# Patient Record
Sex: Female | Born: 1970 | Race: White | Hispanic: No | Marital: Single | Smoking: Never smoker
Health system: Southern US, Community
[De-identification: ages and names within clinical notes are randomized; demographics above are authoritative.]

## PROBLEM LIST (undated history)

## (undated) DIAGNOSIS — E7800 Pure hypercholesterolemia, unspecified: Secondary | ICD-10-CM

## (undated) DIAGNOSIS — I10 Essential (primary) hypertension: Secondary | ICD-10-CM

## (undated) DIAGNOSIS — E282 Polycystic ovarian syndrome: Secondary | ICD-10-CM

## (undated) DIAGNOSIS — M1990 Unspecified osteoarthritis, unspecified site: Secondary | ICD-10-CM

## (undated) DIAGNOSIS — E669 Obesity, unspecified: Secondary | ICD-10-CM

## (undated) DIAGNOSIS — G4730 Sleep apnea, unspecified: Secondary | ICD-10-CM

## (undated) HISTORY — PX: CHOLECYSTECTOMY: SHX55

## (undated) HISTORY — DX: Pure hypercholesterolemia, unspecified: E78.00

## (undated) HISTORY — PX: OTHER SURGICAL HISTORY: SHX169

## (undated) HISTORY — DX: Obesity, unspecified: E66.9

## (undated) HISTORY — PX: TOTAL KNEE ARTHROPLASTY: SHX125

## (undated) HISTORY — PX: ABDOMINAL SURGERY: SHX537

## (undated) HISTORY — DX: Sleep apnea, unspecified: G47.30

## (undated) HISTORY — DX: Polycystic ovarian syndrome: E28.2

---

## 1999-05-22 ENCOUNTER — Other Ambulatory Visit: Admission: RE | Admit: 1999-05-22 | Discharge: 1999-05-22 | Payer: Self-pay | Admitting: Obstetrics and Gynecology

## 2000-07-03 ENCOUNTER — Other Ambulatory Visit: Admission: RE | Admit: 2000-07-03 | Discharge: 2000-07-03 | Payer: Self-pay | Admitting: Obstetrics and Gynecology

## 2002-01-26 ENCOUNTER — Encounter: Payer: Self-pay | Admitting: Occupational Medicine

## 2002-01-26 ENCOUNTER — Encounter: Admission: RE | Admit: 2002-01-26 | Discharge: 2002-01-26 | Payer: Self-pay | Admitting: Occupational Medicine

## 2002-11-08 ENCOUNTER — Inpatient Hospital Stay (HOSPITAL_COMMUNITY): Admission: EM | Admit: 2002-11-08 | Discharge: 2002-11-12 | Payer: Self-pay | Admitting: Psychiatry

## 2009-05-22 ENCOUNTER — Encounter: Admission: RE | Admit: 2009-05-22 | Discharge: 2009-05-22 | Payer: Self-pay | Admitting: Internal Medicine

## 2010-06-07 NOTE — Discharge Summary (Signed)
NAME:  Kristin Zhang, Kristin Zhang                          ACCOUNT NO.:  [id number]   MEDICAL RECORD NO.:  [id number]                   PATIENT TYPE:  IPS   LOCATION:  0303                                 FACILITY:  [facility]   PHYSICIAN:  Geoffery Lyons, M.D.                   DATE OF BIRTH:  01-17-1971   DATE OF ADMISSION:  11/08/2002  DATE OF DISCHARGE:  11/12/2002                                 DISCHARGE SUMMARY   CHIEF COMPLAINT AND PRESENT ILLNESS:  This was the first admission to [facility] for this 40 year old single white female voluntarily  admitted.  Referred by the PCP after calling them as she woke up and felt  she was not able to go to work, felt overwhelmed by depression.  Two months  onset of gradually increased severity, anhedonia, episodes of psychomotor  agitation, irritability, thoughts of suicide by overdosing, suspiciousness,  reclusive behaviors.   PAST PSYCHIATRIC HISTORY:  First time at [facility].  Never on  psychotropics.   ALCOHOL/DRUG HISTORY:  Denies the use or abuse of any substances.   PAST MEDICAL HISTORY:  Polycystic ovarian disease, hypothyroidism.   MEDICATIONS:  Metformin, Avandia 8 mg at night, Synthroid 150 mcg daily.   PHYSICAL EXAMINATION:  Performed and failed to show any acute findings.   MENTAL STATUS EXAM:  Fully alert female.  Mood anxious and depressed,  tearful at times.  Speech normal production, tempo, rate.  Affect depressed.  Thought processes positive for some agitation, blocking, suicidal ideation  with a plan when she was admitted.  Denies any plan at the time of this  evaluation.  No homicidal ideation.  No auditory or visual hallucinations.  Cognition well-preserved.   ADMISSION DIAGNOSES:   AXIS I:  Major depression, recurrent.   AXIS II:  No diagnosis.   AXIS III:  1. Hypothyroidism.  2. Polycystic ovary disease.   AXIS IV:  Moderate.   AXIS V:  Global Assessment of Functioning upon admission 30;  highest Global  Assessment of Functioning in the last year 68.   LABORATORY DATA:  CBC with hemoglobin 11.9.  Blood chemistry within normal  limits.  Thyroid profile within normal limits.  TSH 2.154.  Urine pregnancy  test negative.  Drug screen negative for substances of abuse.   HOSPITAL COURSE:  She was admitted and started intensive individual and  group psychotherapy.  She was initially given Ambien for sleep.  She was  given some Seroquel 25 mg every six hours for anxiety and agitation.  Kept  on Avandia 8 mg at night, Synthroid 0.15 mg daily.  She was started on  Lexapro 10 mg per day.  Seroquel was increased to 50 mg at bedtime.  Initially, she was somewhat agitated.  She then started to settle down with  occasional reports of thoughts of wanting to die.  She felt that the stress  was  building up until she was unable to cope.  Had been staying to herself,  isolating, withdrawing, so family or friends were not aware of the way she  was feeling.  There were some suicidal ruminations but she was able to  contract for safety.  As the hospitalization progressed, she started feeling  somewhat better, more hopeful.  She was able to open up, talk in group and  individually about her issues.  Started working and resolving some of them.  We increased the Seroquel, which was effective for sleep.  There was a  family session with the boyfriend, the mother and the sister.  She was to  return home with the boyfriend.  On November 12, 2002, she was in full  contact with reality.  She denied any suicidal or homicidal ideation.  She  was willing and motivated to pursue further outpatient treatment.  The  family felt she was ready to go.  She definitely had more insight and had  worked on her coping skills and stress management.   DISCHARGE DIAGNOSES:   AXIS I:  Major depression, single episode.   AXIS II:  No diagnosis.   AXIS III:  1. Hypothyroidism.  2. Polycystic ovary disease.   AXIS  IV:  Moderate.   AXIS V:  Global Assessment of Functioning upon discharge 55-60.   DISCHARGE MEDICATIONS:  1. Avandia 8 mg at bedtime.  2. Levothyroxine 150 mcg in the morning.  3. Lexapro 10 mg daily.  4. Hydrochlorothiazide 25 mg daily.  5. Seroquel 25 mg, 2 at night.   FOLLOW UP:  Dr. Olena Leatherwood, [facility]                                               Geoffery Lyons, M.D.    IL/MEDQ  D:  12/07/2002  T:  12/07/2002  Job:  [id number]

## 2015-07-15 ENCOUNTER — Ambulatory Visit (HOSPITAL_COMMUNITY)
Admission: EM | Admit: 2015-07-15 | Discharge: 2015-07-15 | Disposition: A | Discharge status: Home / Self Care | Payer: BLUE CROSS/BLUE SHIELD | Attending: Emergency Medicine | Admitting: Emergency Medicine

## 2015-07-15 ENCOUNTER — Encounter (HOSPITAL_COMMUNITY): Payer: Self-pay

## 2015-07-15 DIAGNOSIS — J069 Acute upper respiratory infection, unspecified: Secondary | ICD-10-CM

## 2015-07-15 DIAGNOSIS — B9789 Other viral agents as the cause of diseases classified elsewhere: Principal | ICD-10-CM

## 2015-07-15 HISTORY — DX: Unspecified osteoarthritis, unspecified site: M19.90

## 2015-07-15 HISTORY — DX: Essential (primary) hypertension: I10

## 2015-07-15 MED ORDER — BENZONATATE 200 MG PO CAPS: 200.0000 mg | ORAL_CAPSULE | Freq: Two times a day (BID) | ORAL | Status: DC | PRN

## 2015-07-15 MED ORDER — IPRATROPIUM BROMIDE 0.06 % NA SOLN: 2.0000 | Freq: Four times a day (QID) | NASAL | Status: DC

## 2015-07-15 NOTE — Discharge Instructions (Signed)
You have an upper respiratory infection or cold. Take a daily allergy pill such as Claritin or Zyrtec to help with the congestion and drainage. Use Atrovent nasal spray 4 times a day to help with congestion and drainage. Use the Tessalon twice a day as needed for coughing. Symptoms typically peak on day 3 or 4 and take another 3-4 days to resolve. If you develop fevers, sputum, or trouble breathing, please come back.

## 2015-07-15 NOTE — ED Notes (Signed)
Patient presents with cold-like symptoms x2 days, pt has taken OTC medications to treat symptoms, she is experience nasal congestion, headache, and  persistently coughing No acute distress

## 2015-07-15 NOTE — ED Provider Notes (Signed)
CSN: [id number]     Arrival date & time 07/15/15  1750 History   First MD Initiated Contact with Patient 07/15/15 1805     Chief Complaint  Patient presents with  . Nasal Congestion   (Consider location/radiation/quality/duration/timing/severity/associated sxs/prior Treatment) HPI  She is a 45 year old woman here for evaluation of nasal congestion. Her symptoms started yesterday with nasal congestion, rhinorrhea, sore throat, and cough. Cough is intermittently productive of clear sputum. She denies any wheezing or shortness of breath. No fevers or chills. No nausea or vomiting. No ear pain. She has tried DayQuil without improvement. She states the cough is the most bothersome thing for her.  Past Medical History  Diagnosis Date  . Arthritis   . Hypertension    History reviewed. No pertinent past surgical history. No family history on file. Social History  Substance Use Topics  . Smoking status: Never Smoker   . Smokeless tobacco: Never Used  . Alcohol Use: No   OB History    No data available     Review of Systems As in history of present illness Allergies  Lisinopril  Home Medications   Prior to Admission medications   Medication Sig Start Date End Date Taking? Authorizing Provider  buPROPion (WELLBUTRIN XL) 300 MG 24 hr tablet Take 300 mg by mouth daily.   Yes Historical Provider, MD  Levomilnacipran HCl ER (FETZIMA) 80 MG CP24 Take 80 mg by mouth daily.   Yes Historical Provider, MD  levothyroxine (SYNTHROID, LEVOTHROID) 150 MCG tablet Take 150 mcg by mouth daily before breakfast.   Yes Historical Provider, MD  LOSARTAN POTASSIUM PO Take 150 mg by mouth daily.   Yes Historical Provider, MD  VERAPAMIL HCL PO Take 150 mg by mouth daily.   Yes Historical Provider, MD  VITAMIN E PO Take 2,000 Units by mouth daily.   Yes Historical Provider, MD  benzonatate (TESSALON) 200 MG capsule Take 1 capsule (200 mg total) by mouth 2 (two) times daily as needed for cough. 07/15/15   Charm RingsErin  J Aviv Lengacher, MD  ipratropium (ATROVENT) 0.06 % nasal spray Place 2 sprays into both nostrils 4 (four) times daily. 07/15/15   Charm RingsErin J Raju Coppolino, MD   Meds Ordered and Administered this Visit  Medications - No data to display  BP 146/93 mmHg  Pulse 97  Temp(Src) 98.2 F (36.8 C) (Oral)  Resp 22  SpO2 100%  LMP 07/13/2015 (Exact Date) No data found.   Physical Exam  Constitutional: She is oriented to person, place, and time. She appears well-developed and well-nourished. No distress.  HENT:  Mouth/Throat: No oropharyngeal exudate.  Oropharynx mildly erythematous with postnasal drainage. Nasal discharge present, but mucosa is normal. TMs normal bilaterally.  Neck: Neck supple.  Cardiovascular: Normal rate, regular rhythm and normal heart sounds.   No murmur heard. Pulmonary/Chest: Effort normal and breath sounds normal. No respiratory distress. She has no wheezes. She has no rales.  Lymphadenopathy:    She has no cervical adenopathy.  Neurological: She is alert and oriented to person, place, and time.    ED Course  Procedures (including critical care time)  Labs Review Labs Reviewed - No data to display  Imaging Review No results found.   MDM   1. Viral URI with cough    Symptomatic treatment discussed. Recommended OTC allergy pill daily. Prescriptions given for Atrovent nasal spray and Tessalon for cough. Return precautions reviewed.    Charm RingsErin J Cleaster Shiffer, MD 07/15/15 [phone]

## 2016-11-21 ENCOUNTER — Other Ambulatory Visit: Payer: Self-pay | Admitting: Occupational Medicine

## 2016-11-21 ENCOUNTER — Ambulatory Visit: Payer: Self-pay

## 2016-11-21 DIAGNOSIS — M25522 Pain in left elbow: Secondary | ICD-10-CM

## 2016-11-21 DIAGNOSIS — M25521 Pain in right elbow: Secondary | ICD-10-CM

## 2017-05-18 ENCOUNTER — Ambulatory Visit: Payer: BLUE CROSS/BLUE SHIELD | Admitting: Obstetrics & Gynecology

## 2017-05-18 ENCOUNTER — Encounter: Payer: Self-pay | Admitting: Obstetrics & Gynecology

## 2017-05-18 VITALS — BP 154/93 | HR 104 | Ht 68.0 in | Wt 308.0 lb

## 2017-05-18 DIAGNOSIS — R102 Pelvic and perineal pain: Secondary | ICD-10-CM

## 2017-05-18 NOTE — Progress Notes (Signed)
Pt states that she has lower abdominal pain since Feb. CT scan didn't show anything  Last pap less than a year ago- normal

## 2017-05-18 NOTE — Progress Notes (Signed)
   Subjective:    Patient ID: Kristin Zhang, female    DOB: 1970/03/05, 47 y.o.   MRN: [id number]  HPI 47 yo engaged P0 here today as a new patient with abdominopelvic pain. She had an xray with "fecal retention" and a normal CT done 04/28/17. The pain started about 04-21-17 and is better now. "I just get a twinge now and again". There is nothing that made the pain better or worse. She tried IBU.   She report that she had a pap smear less than a year ago, done at [facility]. It was normal. Her mammogram is due next month. It is set up.  She has occasional dysparuenia since the beginning of the month. She uses OCPs for contraception and treatment of heavy periods.   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    Objective:   Physical Exam Breathing, conversing, and ambulating normally Well nourished, well hydrated White female, no apparent distress Abd- benign, morbidly obese Bimanual exam reveals no masses or tenderness     Assessment & Plan:  abdomino pelvic pain with normal gyn organs seen on CT- pain mostly resolved currently Reassurance given Preventative care- Come back for pap in 3 years/prn sooner

## 2017-10-05 ENCOUNTER — Telehealth: Payer: Self-pay

## 2017-10-05 DIAGNOSIS — IMO0001 Reserved for inherently not codable concepts without codable children: Secondary | ICD-10-CM

## 2017-10-05 MED ORDER — NORETHINDRONE-ETH ESTRADIOL 0.5-35 MG-MCG PO TABS: 1.0000 | ORAL_TABLET | Freq: Every day | ORAL | 7 refills | Status: DC

## 2017-10-05 NOTE — Telephone Encounter (Signed)
Pt called needing refill of OCP. Refills sent in until time for her annual.

## 2018-03-19 ENCOUNTER — Emergency Department (INDEPENDENT_AMBULATORY_CARE_PROVIDER_SITE_OTHER)
Admission: EM | Admit: 2018-03-19 | Discharge: 2018-03-19 | Disposition: A | Discharge status: Home / Self Care | Payer: BLUE CROSS/BLUE SHIELD | Source: Home / Self Care | Attending: Family Medicine | Admitting: Family Medicine

## 2018-03-19 ENCOUNTER — Other Ambulatory Visit: Payer: Self-pay | Admitting: Obstetrics & Gynecology

## 2018-03-19 ENCOUNTER — Other Ambulatory Visit: Payer: Self-pay

## 2018-03-19 DIAGNOSIS — B9789 Other viral agents as the cause of diseases classified elsewhere: Secondary | ICD-10-CM

## 2018-03-19 DIAGNOSIS — J069 Acute upper respiratory infection, unspecified: Secondary | ICD-10-CM | POA: Diagnosis not present

## 2018-03-19 MED ORDER — BENZONATATE 200 MG PO CAPS: ORAL_CAPSULE | ORAL | 0 refills | Status: DC

## 2018-03-19 MED ORDER — DOXYCYCLINE HYCLATE 100 MG PO CAPS: 100.0000 mg | ORAL_CAPSULE | Freq: Two times a day (BID) | ORAL | 0 refills | Status: DC

## 2018-03-19 NOTE — Discharge Instructions (Addendum)
Take plain guaifenesin (1200mg  extended release tabs such as Mucinex) twice daily, with plenty of water, for cough and congestion. Get adequate rest.   For nasal congestion may use Afrin nasal spray (or generic oxymetazoline) each morning for about 5 days and then discontinue.  Also recommend using saline nasal spray several times daily and saline nasal irrigation (AYR is a common brand).  Use Flonase nasal spray each morning after using Afrin nasal spray and saline nasal irrigation. Try warm salt water gargles for sore throat.  Stop all antihistamines for now, and other non-prescription cough/cold preparations. May take Delsym Cough Suppressant with Tessalon at bedtime for nighttime cough.  Begin Doxycycline if not improving about one week or if persistent fever develops

## 2018-03-19 NOTE — ED Provider Notes (Signed)
[facility]    CSN: [id number] Arrival date & time: 03/19/18  1024     History   Chief Complaint Chief Complaint  Patient presents with  . Cough    Sneezing, Chest congestion    HPI Kristin Zhang is a 48 y.o. female.   Patient complains of four day history of typical cold-like symptoms developing over several days, including mild sore throat, sinus congestion, headache, fatigue, and cough.  She denies fevers, chills, and sweats, pleuritic pain, and shortness of breath.  She had pneumonia in 2018  The history is provided by the patient.    Past Medical History:  Diagnosis Date  . Arthritis   . High cholesterol   . Hypertension   . PCOS (polycystic ovarian syndrome)   . Sleep apnea     Patient Active Problem List   Diagnosis Date Noted  . Morbid obesity (HCC) 05/18/2017    Past Surgical History:  Procedure Laterality Date  . appendix removal    . gallbladder removal    . shoulder surgery      OB History    Gravida  0   Para  0   Term  0   Preterm  0   AB  0   Living  0     SAB  0   TAB  0   Ectopic  0   Multiple  0   Live Births  0            Home Medications    Prior to Admission medications   Medication Sig Start Date End Date Taking? Authorizing Provider  atorvastatin (LIPITOR) 20 MG tablet Take 20 mg by mouth daily.    [provider]  benzonatate (TESSALON) 200 MG capsule Take one cap by mouth at bedtime as needed for cough.  May repeat in 4 to 6 hours 03/19/18   Lattie Haw, MD  buPROPion (WELLBUTRIN XL) 300 MG 24 hr tablet Take 300 mg by mouth daily.    [provider]  doxycycline (VIBRAMYCIN) 100 MG capsule Take 1 capsule (100 mg total) by mouth 2 (two) times daily. Take with food (Rx void after 03/27/18). 03/19/18   Lattie Haw, MD  ipratropium (ATROVENT) 0.06 % nasal spray Place 2 sprays into both nostrils 4 (four) times daily. Patient not taking: Reported on 05/18/2017 07/15/15   Charm Rings, MD  Levomilnacipran HCl ER (FETZIMA) 80 MG CP24 Take 80 mg by mouth daily.    [provider]  levothyroxine (SYNTHROID, LEVOTHROID) 150 MCG tablet Take 150 mcg by mouth daily before breakfast.    [provider]  LOSARTAN POTASSIUM PO Take 150 mg by mouth daily.    [provider]  Multiple Vitamin (MULTIVITAMIN) tablet Take 1 tablet by mouth daily.    [provider]  NORTREL 0.5/35, 28, 0.5-35 MG-MCG tablet TAKE 1 TABLET BY MOUTH EVERY DAY 03/19/18   Nicholaus Bloom C, MD  oxycodone-acetaminophen (LYNOX) 10-300 MG tablet Take 1 tablet by mouth every 4 (four) hours as needed for pain.    [provider]  pioglitazone (ACTOS) 45 MG tablet Take 45 mg by mouth daily.    [provider]  VERAPAMIL HCL PO Take 150 mg by mouth daily.    [provider]  VITAMIN E PO Take 2,000 Units by mouth daily.    [provider]    Family History Family History  Problem Relation Age of Onset  . Breast cancer  Sister     Social History Social History   Tobacco Use  . Smoking status: Never Smoker  . Smokeless tobacco: Never Used  Substance Use Topics  . Alcohol use: No  . Drug use: No     Allergies   Lisinopril and Metformin and related   Review of Systems Review of Systems + sore throat + hoarse + cough No pleuritic pain No wheezing + nasal congestion + post-nasal drainage + sinus pain/pressure No itchy/red eyes No earache No hemoptysis No SOB No fever, + chills No nausea No vomiting No abdominal pain No diarrhea No urinary symptoms No skin rash + fatigue No myalgias + headache Used OTC meds without relief   Physical Exam Triage Vital Signs ED Triage Vitals  Enc Vitals Group     BP 03/19/18 1055 (!) 157/96     Pulse Rate 03/19/18 1055 96     Resp 03/19/18 1055 18     Temp 03/19/18 1055 98.1 F (36.7 C)     Temp Source 03/19/18 1055 Oral     SpO2 03/19/18 1055 98 %     Weight 03/19/18 1056  (!) 301 lb (136.5 kg)     Height 03/19/18 1056 5\' 8"  (1.727 m)     Head Circumference --      Peak Flow --      Pain Score 03/19/18 1055 0     Pain Loc --      Pain Edu? --      Excl. in GC? --    No data found.  Updated Vital Signs BP (!) 157/96 (BP Location: Right Arm)   Pulse 96   Temp 98.1 F (36.7 C) (Oral)   Resp 18   Ht 5\' 8"  (1.727 m)   Wt (!) 136.5 kg   LMP 02/19/2018 (LMP Unknown)   SpO2 98%   BMI 45.77 kg/m   Visual Acuity Right Eye Distance:   Left Eye Distance:   Bilateral Distance:    Right Eye Near:   Left Eye Near:    Bilateral Near:     Physical Exam Nursing notes and Vital Signs reviewed. Appearance:  Patient appears stated age, and in no acute distress Eyes:  Pupils are equal, round, and reactive to light and accomodation.  Extraocular movement is intact.  Conjunctivae are not inflamed  Ears:  Canals normal.  Tympanic membranes normal.  Nose:  Congested turbinates.  No sinus tenderness.    Pharynx:  Normal Neck:  Supple.  Enlarged posterior/lateral nodes are palpated bilaterally, tender to palpation on the left.   Lungs:  Clear to auscultation.  Breath sounds are equal.  Moving air well. Heart:  Regular rate and rhythm without murmurs, rubs, or gallops.  Abdomen:  Nontender without masses or hepatosplenomegaly.  Bowel sounds are present.  No CVA or flank tenderness.  Extremities:  No edema.  Skin:  No rash present.    UC Treatments / Results  Labs (all labs ordered are listed, but only abnormal results are displayed) Labs Reviewed - No data to display  EKG None  Radiology No results found.  Procedures Procedures (including critical care time)  Medications Ordered in UC Medications - No data to display  Initial Impression / Assessment and Plan / UC Course  I have reviewed the triage vital signs and the nursing notes.  Pertinent labs & imaging results that were available during my care of the patient were reviewed by me and  considered in my medical decision making (see chart  for details).    There is no evidence of bacterial infection today.  Treat symptomatically for now  Prescription written for Benzonatate (Tessalon) to take at bedtime for night-time cough.  Followup with Family Doctor if not improved in about 10 days.   Final Clinical Impressions(s) / UC Diagnoses   Final diagnoses:  Viral URI with cough     Discharge Instructions     Take plain guaifenesin (  extended release tabs such as Mucinex) twice daily, with plenty of water, for cough and congestion. Get adequate rest.   For nasal congestion may use Afrin nasal spray (or generic oxymetazoline) each morning for about 5 days and then discontinue.  Also recommend using saline nasal spray several times daily and saline nasal irrigation (AYR is a common brand).  Use Flonase nasal spray each morning after using Afrin nasal spray and saline nasal irrigation. Try warm salt water gargles for sore throat.  Stop all antihistamines for now, and other non-prescription cough/cold preparations. May take Delsym Cough Suppressant with Tessalon at bedtime for nighttime cough.  Begin Doxycycline if not improving about one week or if persistent fever develops (Given a prescription to hold, with an expiration date)       ED Prescriptions    Medication Sig Dispense Auth. Provider   benzonatate (TESSALON) 200 MG capsule Take one cap by mouth at bedtime as needed for cough.  May repeat in 4 to 6 hours 15 capsule Lattie Haw, MD   doxycycline (VIBRAMYCIN) 100 MG capsule Take 1 capsule (100 mg total) by mouth 2 (two) times daily. Take with food (Rx void after 03/27/18). 14 capsule Lattie Haw, MD         Lattie Haw, MD 03/19/18 [phone]

## 2018-03-19 NOTE — ED Triage Notes (Signed)
Pt c/o cold like sxs since Tuesday. Sneezing, non productive cough, headache. Taking mucinex and robitussin prn.

## 2018-05-20 ENCOUNTER — Emergency Department
Admission: EM | Admit: 2018-05-20 | Discharge: 2018-05-20 | Disposition: A | Discharge status: Home / Self Care | Payer: BLUE CROSS/BLUE SHIELD | Source: Home / Self Care | Attending: Family Medicine | Admitting: Family Medicine

## 2018-05-20 ENCOUNTER — Emergency Department (INDEPENDENT_AMBULATORY_CARE_PROVIDER_SITE_OTHER): Payer: BLUE CROSS/BLUE SHIELD

## 2018-05-20 ENCOUNTER — Encounter: Payer: Self-pay | Admitting: Emergency Medicine

## 2018-05-20 ENCOUNTER — Other Ambulatory Visit: Payer: Self-pay

## 2018-05-20 DIAGNOSIS — I10 Essential (primary) hypertension: Secondary | ICD-10-CM

## 2018-05-20 DIAGNOSIS — R109 Unspecified abdominal pain: Secondary | ICD-10-CM

## 2018-05-20 DIAGNOSIS — R300 Dysuria: Secondary | ICD-10-CM

## 2018-05-20 DIAGNOSIS — R05 Cough: Secondary | ICD-10-CM | POA: Diagnosis not present

## 2018-05-20 DIAGNOSIS — R197 Diarrhea, unspecified: Secondary | ICD-10-CM

## 2018-05-20 DIAGNOSIS — R053 Chronic cough: Secondary | ICD-10-CM

## 2018-05-20 DIAGNOSIS — K219 Gastro-esophageal reflux disease without esophagitis: Secondary | ICD-10-CM | POA: Diagnosis not present

## 2018-05-20 LAB — POCT CBC W AUTO DIFF (K'VILLE URGENT CARE)

## 2018-05-20 LAB — POCT URINALYSIS DIP (MANUAL ENTRY)
Bilirubin, UA: NEGATIVE
Glucose, UA: NEGATIVE mg/dL
Ketones, POC UA: NEGATIVE mg/dL
Leukocytes, UA: NEGATIVE
Nitrite, UA: NEGATIVE
Protein Ur, POC: NEGATIVE mg/dL
Spec Grav, UA: 1.015 (ref 1.010–1.025)
Urobilinogen, UA: 0.2 E.U./dL
pH, UA: 7.5 (ref 5.0–8.0)

## 2018-05-20 MED ORDER — PANTOPRAZOLE SODIUM 20 MG PO TBEC: 20.0000 mg | DELAYED_RELEASE_TABLET | Freq: Every day | ORAL | 0 refills | Status: DC

## 2018-05-20 NOTE — ED Triage Notes (Signed)
Diarrhea, Indigestion, gas, blotting, cough x 2 weeks

## 2018-05-20 NOTE — ED Provider Notes (Signed)
[facility]    CSN: [id number] Arrival date & time: 05/20/18  0932     History   Chief Complaint Chief Complaint  Patient presents with   Diarrhea    HPI Kristin Zhang is a 48 y.o. female.   Patient presents with several complaints: 1)  She has had loose stools for 2 weeks, worse after eating, and associated with bloating and indigestion.  She has had a cholecystectomy in the distant past, and recalls having occasional diarrhea after her cholecystectomy.  She has had two colonoscopies; her last one about 4 to 5 years ago showed diverticulosis. 2)  She has had persistent heartburn during the past two weeks, worse at bedtime.  She denies nausea/vomiting. She states that she has had chills/sweats for two days.  She reports increased urinary frequency,  and notes that she is presently having her menses. 3)  She has had a persistent cough for at least two weeks, worse in the morning upon awakening.  She complains of tightness and pain in her anterior chest, but no shortness of breath or wheezing. 4)  She has a history of hypertension that improved after she started using CPAP, and she discontinued her metoprolol.  The history is provided by the patient.    Past Medical History:  Diagnosis Date   Arthritis    High cholesterol    Hypertension    PCOS (polycystic ovarian syndrome)    Sleep apnea     Patient Active Problem List   Diagnosis Date Noted   Morbid obesity (HCC) 05/18/2017    Past Surgical History:  Procedure Laterality Date   ABDOMINAL SURGERY     appendix removal     CHOLECYSTECTOMY     gallbladder removal     shoulder surgery      OB History    Gravida  0   Para  0   Term  0   Preterm  0   AB  0   Living  0     SAB  0   TAB  0   Ectopic  0   Multiple  0   Live Births  0            Home Medications    Prior to Admission medications   Medication Sig Start Date End Date Taking? Authorizing Provider    atorvastatin (LIPITOR) 20 MG tablet Take 20 mg by mouth daily.    [provider]  buPROPion (WELLBUTRIN XL) 300 MG 24 hr tablet Take 300 mg by mouth daily.    [provider]  ipratropium (ATROVENT) 0.06 % nasal spray Place 2 sprays into both nostrils 4 (four) times daily. Patient not taking: Reported on 05/18/2017 07/15/15   Charm Rings, MD  Levomilnacipran HCl ER (FETZIMA) 80 MG CP24 Take 80 mg by mouth daily.    [provider]  levothyroxine (SYNTHROID, LEVOTHROID) 150 MCG tablet Take 150 mcg by mouth daily before breakfast.    [provider]  LOSARTAN POTASSIUM PO Take 150 mg by mouth daily.    [provider]  Multiple Vitamin (MULTIVITAMIN) tablet Take 1 tablet by mouth daily.    [provider]  NORTREL 0.5/35, 28, 0.5-35 MG-MCG tablet TAKE 1 TABLET BY MOUTH EVERY DAY 03/19/18   Nicholaus Bloom C, MD  oxycodone-acetaminophen (LYNOX) 10-300 MG tablet Take 1 tablet by mouth every 4 (four) hours as needed for pain.    [provider]  pantoprazole (PROTONIX) 20 MG tablet Take  1 tablet (20 mg total) by mouth daily. Take 20 minutes before a meal. 05/20/18   Lattie Haw, MD  pioglitazone (ACTOS) 45 MG tablet Take 45 mg by mouth daily.    [provider]    Family History Family History  Problem Relation Age of Onset   Breast cancer Sister    Hypertension Mother    Hypertension Father     Social History Social History   Tobacco Use   Smoking status: Never Smoker   Smokeless tobacco: Never Used  Substance Use Topics   Alcohol use: No   Drug use: No     Allergies   Lisinopril and Metformin and related   Review of Systems Review of Systems  Constitutional: Positive for appetite change, chills, diaphoresis and fatigue. Negative for activity change and fever.  HENT: Negative.   Eyes: Negative.   Respiratory: Positive for cough and chest tightness. Negative for shortness of breath, wheezing and  stridor.   Cardiovascular: Negative for chest pain, palpitations and leg swelling.  Gastrointestinal: Positive for abdominal distention, abdominal pain and diarrhea. Negative for anal bleeding, blood in stool, constipation, nausea, rectal pain and vomiting.  Genitourinary: Positive for frequency. Negative for dysuria, flank pain, hematuria, pelvic pain, urgency and vaginal discharge.  Musculoskeletal: Negative.   Skin: Negative.   Neurological: Negative for headaches.     Physical Exam Triage Vital Signs ED Triage Vitals  Enc Vitals Group     BP 05/20/18 1008 (!) 160/102     Pulse Rate 05/20/18 1008 88     Resp --      Temp 05/20/18 1008 98.7 F (37.1 C)     Temp Source 05/20/18 1008 Oral     SpO2 05/20/18 1008 98 %     Weight 05/20/18 1009 300 lb (136.1 kg)     Height 05/20/18 1009  (1.727 m)     Head Circumference --      Peak Flow --      Pain Score 05/20/18 1009 5     Pain Loc --      Pain Edu? --      Excl. in GC? --    No data found.  Updated Vital Signs BP (!) 160/102 (BP Location: Right Arm)    Pulse 88    Temp 98.7 F (37.1 C) (Oral)    Ht  (1.727 m)    Wt 136.1 kg    LMP 05/20/2018    SpO2 98%    BMI 45.61 kg/m   Visual Acuity Right Eye Distance:   Left Eye Distance:   Bilateral Distance:    Right Eye Near:   Left Eye Near:    Bilateral Near:     Physical Exam Vitals signs and nursing note reviewed.  Constitutional:      General: She is not in acute distress.    Appearance: She is obese.  HENT:     Head: Normocephalic.     Right Ear: External ear normal.     Left Ear: External ear normal.     Nose: Nose normal.     Mouth/Throat:     Pharynx: Oropharynx is clear.  Eyes:     Extraocular Movements: Extraocular movements intact.     Conjunctiva/sclera: Conjunctivae normal.     Pupils: Pupils are equal, round, and reactive to light.  Neck:     Musculoskeletal: Neck supple.  Cardiovascular:     Rate and Rhythm: Normal rate.     Heart  sounds: Normal heart sounds.  Pulmonary:     Breath sounds: Normal breath sounds.  Chest:       Comments: Chest:  Distinct tenderness to palpation over the entire sternum.  Palpation there recreates her anterior chest pain. Abdominal:     General: Abdomen is protuberant. Bowel sounds are increased.     Palpations: Abdomen is soft. There is no hepatomegaly, splenomegaly or mass.     Tenderness: There is abdominal tenderness in the periumbilical area and left lower quadrant. There is no right CVA tenderness, left CVA tenderness or guarding. Negative signs include McBurney's sign.       Comments: Mild periumbilical tenderness to palpation as noted on diagram.   Musculoskeletal:        General: No tenderness.     Right lower leg: No edema.     Left lower leg: No edema.  Lymphadenopathy:     Cervical: No cervical adenopathy.  Skin:    General: Skin is warm and dry.     Findings: Rash present.  Neurological:     General: No focal deficit present.     Mental Status: She is alert.      UC Treatments / Results  Labs (all labs ordered are listed, but only abnormal results are displayed) Labs Reviewed  POCT URINALYSIS DIP (MANUAL ENTRY) - Abnormal; Notable for the following components:      Result Value   Blood, UA large (*)    All other components within normal limits  URINE CULTURE  POCT CBC W AUTO DIFF ([facility]):  WBC 7.2; LY 31.4; MO 6.5; GR 62.1; Hgb 13.8; Platelets 466     EKG None  Radiology Dg Abdomen 1 View  Result Date: 05/20/2018 CLINICAL DATA:  Lower abdominal pain and diarrhea. EXAM: ABDOMEN - 1 VIEW COMPARISON:  None. FINDINGS: The bowel gas pattern is normal. Clips in the right upper quadrant consistent with previous cholecystectomy. No abnormal abdominal calcifications or visible mass lesions. 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 IMPRESSION: Benign-appearing abdomen. Degenerative changes in the lower lumbar spine. Electronically  Signed   By: Francene BoyersJames  Maxwell M.D.   On: 05/20/2018 11:48    Procedures Procedures (including critical care time)  Medications Ordered in UC Medications - No data to display  Initial Impression / Assessment and Plan / UC Course  I have reviewed the triage vital signs and the nursing notes.  Pertinent labs & imaging results that were available during my care of the patient were reviewed by me and considered in my medical decision making (see chart for details).    Suspect GERD as primary cause of cough, and cough contributing to costochondritis. Suspect history of cholecystectomy causing diarrhea. Begin Pantoprazole. Doubt UTI, but will send urine culture. Followup with Family Doctor.   Final Clinical Impressions(s) / UC Diagnoses   Final diagnoses:  Dysuria  Gastroesophageal reflux disease, esophagitis presence not specified  Diarrhea, unspecified type  Persistent cough  Essential hypertension     Discharge Instructions     Resume metoprolol for your high blood pressure.  Monitor blood pressure more frequently at different times of day and record on a calendar. Talk to your family doctor about beginning Questran (cholestyramine) to control your diarrhea. Apply ice pack to your sternum for 20 to 30 minutes, 2 to 3 times daily  Continue until pain decreases.     ED Prescriptions    Medication Sig Dispense Auth. Provider   pantoprazole (PROTONIX) 20 MG tablet Take  1 tablet (20 mg total) by mouth daily. Take 20 minutes before a meal. 30 tablet Lattie Haw, MD         Lattie Haw, MD 05/25/18 [phone]

## 2018-05-20 NOTE — Discharge Instructions (Addendum)
Resume metoprolol for your high blood pressure.  Monitor blood pressure more frequently at different times of day and record on a calendar. Talk to your family doctor about beginning Questran (cholestyramine) to control your diarrhea. Apply ice pack to your sternum for 20 to 30 minutes, 2 to 3 times daily  Continue until pain decreases.

## 2018-05-21 LAB — URINE CULTURE
MICRO NUMBER:: 435359
SPECIMEN QUALITY:: ADEQUATE

## 2018-05-22 ENCOUNTER — Telehealth: Payer: Self-pay

## 2018-05-22 NOTE — Telephone Encounter (Signed)
Pt called given UCX results.  She followed up with PCP yesterday.

## 2018-11-24 ENCOUNTER — Other Ambulatory Visit: Payer: Self-pay | Admitting: Obstetrics & Gynecology

## 2019-08-19 ENCOUNTER — Telehealth: Payer: Self-pay

## 2019-08-19 DIAGNOSIS — Z789 Other specified health status: Secondary | ICD-10-CM

## 2019-08-19 MED ORDER — NORTREL 0.5/35 (28) 0.5-35 MG-MCG PO TABS: 1.0000 | ORAL_TABLET | Freq: Every day | ORAL | 0 refills | Status: DC

## 2019-08-19 NOTE — Telephone Encounter (Signed)
Pt requesting refill of OCP until annual appt on 09/02/19. Refill sent per Cleone Slim, CNM

## 2019-09-02 ENCOUNTER — Other Ambulatory Visit (HOSPITAL_COMMUNITY)
Admission: RE | Admit: 2019-09-02 | Discharge: 2019-09-02 | Disposition: A | Discharge status: Home / Self Care | Payer: BC Managed Care – PPO | Source: Ambulatory Visit | Attending: Certified Nurse Midwife | Admitting: Certified Nurse Midwife

## 2019-09-02 ENCOUNTER — Ambulatory Visit (INDEPENDENT_AMBULATORY_CARE_PROVIDER_SITE_OTHER): Payer: BC Managed Care – PPO | Admitting: Certified Nurse Midwife

## 2019-09-02 ENCOUNTER — Encounter: Payer: Self-pay | Admitting: Certified Nurse Midwife

## 2019-09-02 ENCOUNTER — Other Ambulatory Visit: Payer: Self-pay

## 2019-09-02 VITALS — BP 126/69 | HR 73 | Resp 16 | Ht 68.0 in | Wt 311.0 lb

## 2019-09-02 DIAGNOSIS — Z01419 Encounter for gynecological examination (general) (routine) without abnormal findings: Secondary | ICD-10-CM | POA: Insufficient documentation

## 2019-09-02 DIAGNOSIS — N941 Unspecified dyspareunia: Secondary | ICD-10-CM

## 2019-09-02 DIAGNOSIS — N920 Excessive and frequent menstruation with regular cycle: Secondary | ICD-10-CM

## 2019-09-02 DIAGNOSIS — N898 Other specified noninflammatory disorders of vagina: Secondary | ICD-10-CM | POA: Insufficient documentation

## 2019-09-02 MED ORDER — NORETHINDRONE 0.35 MG PO TABS: 1.0000 | ORAL_TABLET | Freq: Every day | ORAL | 11 refills | Status: DC

## 2019-09-02 NOTE — Patient Instructions (Signed)
Breast Self-Awareness Breast self-awareness means being familiar with how your breasts look and feel. It involves checking your breasts regularly and reporting any changes to your health care provider. Practicing breast self-awareness is important. Sometimes changes may not be harmful (are benign), but sometimes a change in your breasts can be a sign of a serious medical problem. It is important to learn how to do this procedure correctly so that you can catch problems early, when treatment is more likely to be successful. All women should practice breast self-awareness, including women who have had breast implants. What you need:  A mirror.  A well-lit room. How to do a breast self-exam A breast self-exam is one way to learn what is normal for your breasts and whether your breasts are changing. To do a breast self-exam: Look for changes  1. Remove all the clothing above your waist. 2. Stand in front of a mirror in a room with good lighting. 3. Put your hands on your hips. 4. Push your hands firmly downward. 5. Compare your breasts in the mirror. Look for differences between them (asymmetry), such as: ? Differences in shape. ? Differences in size. ? Puckers, dips, and bumps in one breast and not the other. 6. Look at each breast for changes in the skin, such as: ? Redness. ? Scaly areas. 7. Look for changes in your nipples, such as: ? Discharge. ? Bleeding. ? Dimpling. ? Redness. ? A change in position. Feel for changes Carefully feel your breasts for lumps and changes. It is best to do this while lying on your back on the floor, and again while sitting or standing in the tub or shower with soapy water on your skin. Feel each breast in the following way: 1. Place the arm on the side of the breast you are examining above your head. 2. Feel your breast with the other hand. 3. Start in the nipple area and make -inch (2 cm) overlapping circles to feel your breast. Use the pads of your  three middle fingers to do this. Apply light pressure, then medium pressure, then firm pressure. The light pressure will allow you to feel the tissue closest to the skin. The medium pressure will allow you to feel the tissue that is a little deeper. The firm pressure will allow you to feel the tissue close to the ribs. 4. Continue the overlapping circles, moving downward over the breast until you feel your ribs below your breast. 5. Move one finger-width toward the center of the body. Continue to use the -inch (2 cm) overlapping circles to feel your breast as you move slowly up toward your collarbone. 6. Continue the up-and-down exam using all three pressures until you reach your armpit.  Write down what you find Writing down what you find can help you remember what to discuss with your health care provider. Write down:  What is normal for each breast.  Any changes that you find in each breast, including: ? The kind of changes you find. ? Any pain or tenderness. ? Size and location of any lumps.  Where you are in your menstrual cycle, if you are still menstruating. General tips and recommendations  Examine your breasts every month.  If you are breastfeeding, the best time to examine your breasts is after a feeding or after using a breast pump.  If you menstruate, the best time to examine your breasts is 5-7 days after your period. Breasts are generally lumpier during menstrual periods, and it may   be more difficult to notice changes.  With time and practice, you will become more familiar with the variations in your breasts and more comfortable with the exam. Contact a health care provider if you:  See a change in the shape or size of your breasts or nipples.  See a change in the skin of your breast or nipples, such as a reddened or scaly area.  Have unusual discharge from your nipples.  Find a lump or thick area that was not there before.  Have pain in your breasts.  Have any  concerns related to your breast health. Summary  Breast self-awareness includes looking for physical changes in your breasts, as well as feeling for any changes within your breasts.  Breast self-awareness should be performed in front of a mirror in a well-lit room.  You should examine your breasts every month. If you menstruate, the best time to examine your breasts is 5-7 days after your menstrual period.  Let your health care provider know of any changes you notice in your breasts, including changes in size, changes on the skin, pain or tenderness, or unusual fluid from your nipples. This information is not intended to replace advice given to you by your health care provider. Make sure you discuss any questions you have with your health care provider. Document Revised: 08/25/2017 Document Reviewed: 08/25/2017 Elsevier Patient Education  2020 Elsevier Inc[employer]  

## 2019-09-02 NOTE — Progress Notes (Signed)
Gynecology Annual Exam   History of Present Illness: Kristin Zhang is a 49 y.o. married female presenting for an annual exam. She has complaints painful IC, feels tight and dry. She is sexually active. She admits to dyspareunia. She does not perform self breast exams. There is notable family history of breast or ovarian cancer in her family- her sister dx with breast CA in her 49s. She is using OCPs for hx heavy menses .  Past Medical History:  Past Medical History:  Diagnosis Date  . Arthritis   . High cholesterol   . Hypertension   . Obesity   . PCOS (polycystic ovarian syndrome)   . Sleep apnea     Past Surgical History:  Past Surgical History:  Procedure Laterality Date  . ABDOMINAL SURGERY    . appendix removal    . CHOLECYSTECTOMY    . gallbladder removal    . shoulder surgery    . TOTAL KNEE ARTHROPLASTY Right     Gynecologic History:  LMP: Patient's last menstrual period was 08/15/2019. Average Interval: regular Heavy Menses: no Clots: no Intermenstrual Bleeding: no Postcoital Bleeding: no Dysmenorrhea: no Contraception: OCP (estrogen/progesterone) Last Pap: not current, none on file, denies abnormal pap  Mammogram: 08/29/19, normal  Obstetric History: [id number]  Family History:  Family History  Problem Relation Age of Onset  . Breast cancer Sister   . Hypertension Mother   . Hypertension Father     Social History:  Social History   Socioeconomic History  . Marital status: Single    Spouse name: Not on file  . Number of children: Not on file  . Years of education: Not on file  . Highest education level: Not on file  Occupational History  . Not on file  Tobacco Use  . Smoking status: Never Smoker  . Smokeless tobacco: Never Used  Vaping Use  . Vaping Use: Never used  Substance and Sexual Activity  . Alcohol use: No  . Drug use: No  . Sexual activity: Yes    Birth control/protection: Pill  Other Topics Concern  . Not on file  Social History  Narrative  . Not on file   Social Determinants of Health   Financial Resource Strain:   . Difficulty of Paying Living Expenses:   Food Insecurity:   . Worried About Programme researcher, broadcasting/film/video in the Last Year:   . Barista in the Last Year:   Transportation Needs:   . Freight forwarder (Medical):   Marland Kitchen Lack of Transportation (Non-Medical):   Physical Activity:   . Days of Exercise per Week:   . Minutes of Exercise per Session:   Stress:   . Feeling of Stress :   Social Connections:   . Frequency of Communication with Friends and Family:   . Frequency of Social Gatherings with Friends and Family:   . Attends Religious Services:   . Active Member of Clubs or Organizations:   . Attends Banker Meetings:   Marland Kitchen Marital Status:   Intimate Partner Violence:   . Fear of Current or Ex-Partner:   . Emotionally Abused:   Marland Kitchen Physically Abused:   . Sexually Abused:     Allergies:  Allergies  Allergen Reactions  . Lisinopril Swelling  . Prednisone Other (See Comments)    FACE SWELLED  . Metformin And Related     Medications: Prior to Admission medications   Medication Sig Start Date End Date Taking? Authorizing Provider  atorvastatin (  LIPITOR) 20 MG tablet Take 20 mg by mouth daily.   Yes [provider]  buPROPion (WELLBUTRIN XL) 300 MG 24 hr tablet Take 300 mg by mouth daily.   Yes [provider]  ciprofloxacin (CIPRO) 250 MG tablet SMARTSIG:1 Tablet(s) By Mouth Every 12 Hours 08/24/19  Yes [provider]  DULoxetine (CYMBALTA) 30 MG capsule Take by mouth. 12/02/18  Yes [provider]  gabapentin (NEURONTIN) 100 MG capsule Take 200 mg by mouth at bedtime. 06/21/19  Yes [provider]  Levomilnacipran HCl ER (FETZIMA) 80 MG CP24 Take 80 mg by mouth daily.   Yes [provider]  levothyroxine (SYNTHROID, LEVOTHROID) 150 MCG tablet Take 150 mcg by mouth daily before breakfast.   Yes [provider]   LOSARTAN POTASSIUM PO Take 150 mg by mouth daily.   Yes [provider]  Multiple Vitamin (MULTIVITAMIN) tablet Take 1 tablet by mouth daily.   Yes [provider]  norethindrone-ethinyl estradiol (NORTREL 0.5/35, 28,) 0.5-35 MG-MCG tablet Take 1 tablet by mouth daily. 08/19/19  Yes Rolm Bookbinder, CNM  oxycodone-acetaminophen (LYNOX) 10-300 MG tablet Take 1 tablet by mouth every 4 (four) hours as needed for pain.   Yes [provider]  pantoprazole (PROTONIX) 20 MG tablet Take 1 tablet (20 mg total) by mouth daily. Take 20 minutes before a meal. 05/20/18  Yes Beese, Tera Mater, MD  ipratropium (ATROVENT) 0.06 % nasal spray Place 2 sprays into both nostrils 4 (four) times daily. Patient not taking: Reported on 09/02/2019 07/15/15   Charm Rings, MD    Review of Systems: negative except noted in HPI  Physical Exam Vitals: BP 126/69   Pulse 73   Resp 16   Ht 5\' 8"  (1.727 m)   Wt (!) 311 lb (141.1 kg)   LMP 08/15/2019   BMI 47.29 kg/m  General: NAD HEENT: normocephalic, atraumatic Thyroid: no enlargement, no palpable nodules Pulmonary: Normal rate and effort, CTAB Cardiovascular: RRR Breast: Breast symmetrical, no tenderness, no palpable nodules or masses, no skin or nipple retraction present, no nipple discharge. No axillary or supraclavicular lymphadenopathy. Abdomen: soft, non-tender, non-distended. No hepatomegaly, splenomegaly or masses palpable. No evidence of hernia  Genitourinary:  External: Normal external female genitalia. Normal urethral meatus  Vagina: Normal vaginal mucosa, no evidence of prolapse, thin white discharge  Cervix: Grossly normal in appearance, no bleeding  Uterus: deferred d/t body habitus  Adnexa: deferred  Rectal: deferred Extremities: no edema, erythema, or tenderness Neurologic: Grossly intact Psychiatric: mood appropriate, affect full  Female chaperone present for pelvic and breast portions of the physical  exam  Assessment:  1. Well woman exam   2. Vaginal discharge   3. Menorrhagia with regular cycle   4. Dyspareunia, female    Plan: Recommend against continuing estrogen containing pills d/t associated risks compounded by age, obesity, HTN; options include hormonal IUD, POP, or Depo, or she could stop OCP and determine menopausal status; she elects for POPs at this time Rx [place] Recommend using lubrication with IC, if pain not improved could consider vaginal estrogen Follow up with GYN in 1 year or prn Follow up with PCP as scheduled  08/17/2019, CNM 09/02/2019 11:46 AM

## 2019-09-06 LAB — CERVICOVAGINAL ANCILLARY ONLY
Bacterial Vaginitis (gardnerella): NEGATIVE
Candida Glabrata: NEGATIVE
Candida Vaginitis: NEGATIVE
Chlamydia: NEGATIVE
Comment: NEGATIVE
Comment: NEGATIVE
Comment: NEGATIVE
Comment: NEGATIVE
Comment: NEGATIVE
Comment: NORMAL
Neisseria Gonorrhea: NEGATIVE
Trichomonas: NEGATIVE

## 2019-09-07 LAB — CYTOLOGY - PAP
Comment: NEGATIVE
Diagnosis: NEGATIVE
High risk HPV: NEGATIVE

## 2020-09-19 ENCOUNTER — Encounter: Payer: Self-pay | Admitting: Obstetrics and Gynecology

## 2020-09-19 ENCOUNTER — Telehealth: Payer: Self-pay | Admitting: *Deleted

## 2020-09-19 NOTE — Telephone Encounter (Signed)
Left patient an urgent message with self pay amount due at check-in on 10/18/2020.

## 2020-09-20 ENCOUNTER — Telehealth: Payer: BC Managed Care – PPO | Admitting: Obstetrics and Gynecology

## 2020-10-10 IMAGING — DX ABDOMEN - 1 VIEW
3 series · 3 of 3 positions shown · non-contrast
Comparison: None.

CLINICAL DATA: Lower abdominal pain and diarrhea.

EXAM:
ABDOMEN - 1 VIEW

[abdomen kub (1 of 3)]
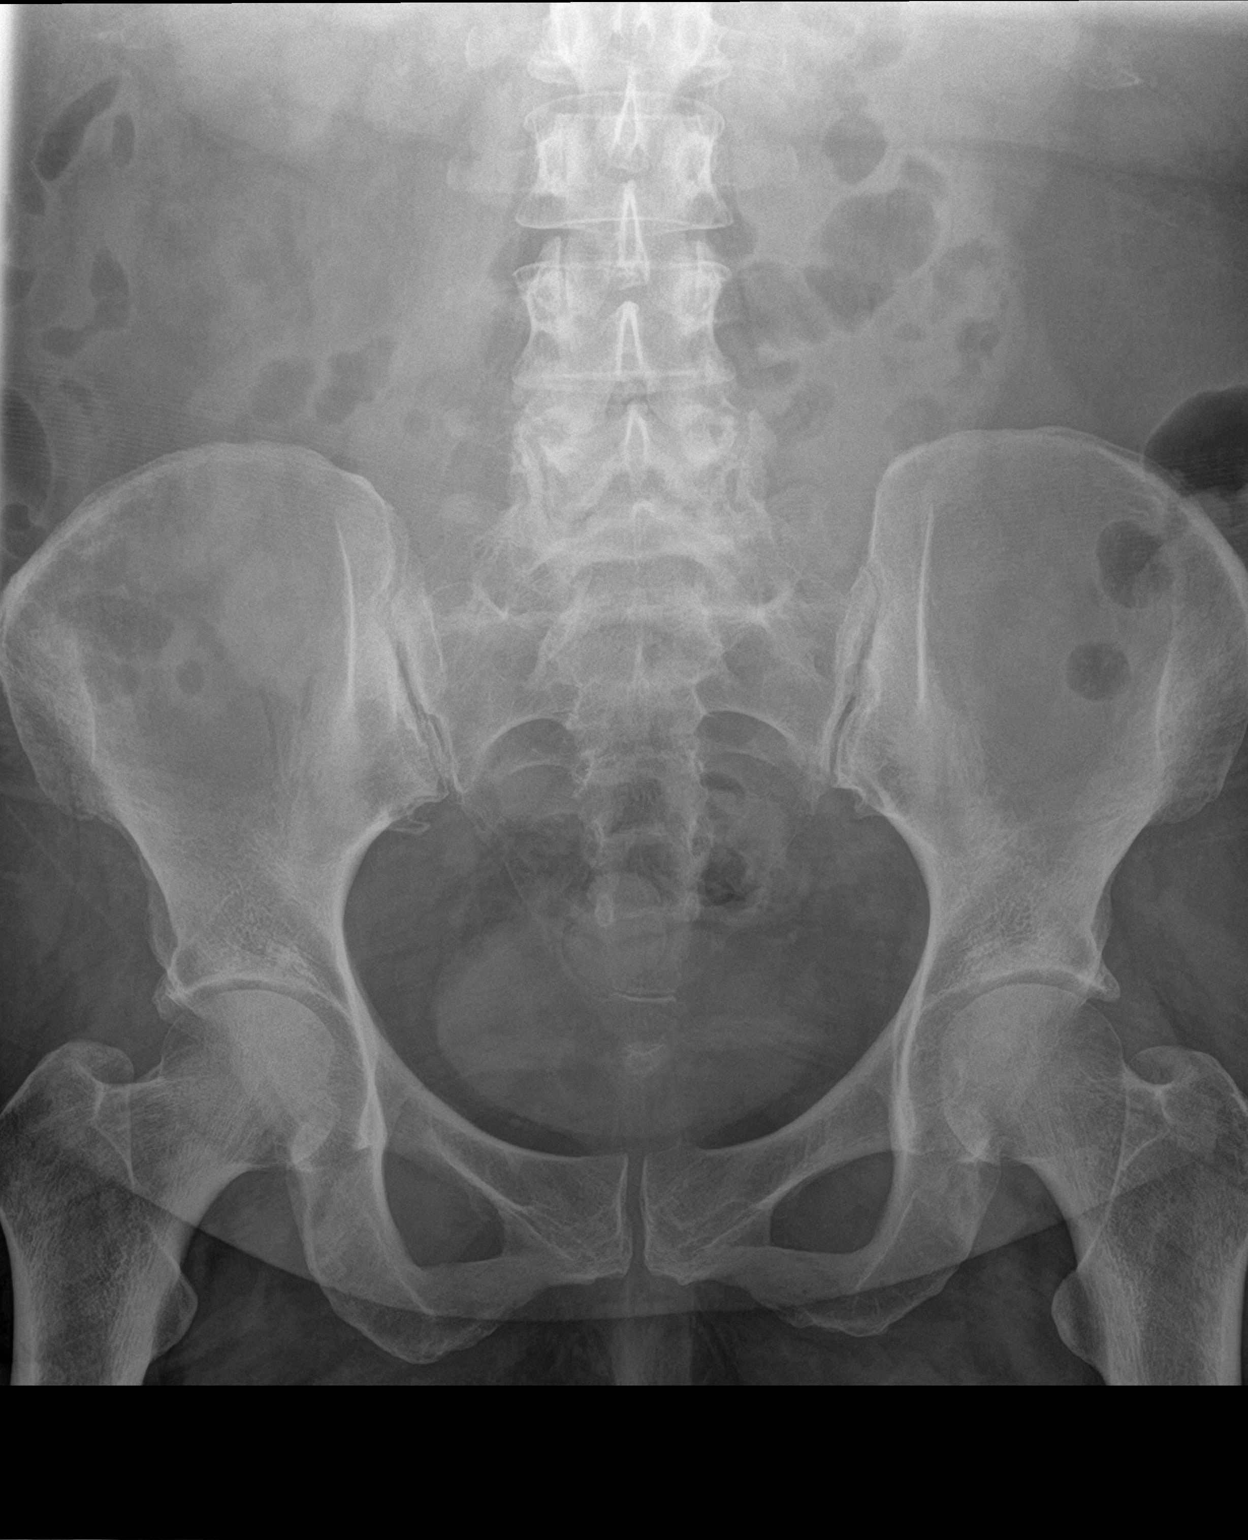

[abdomen kub (2 of 3)]
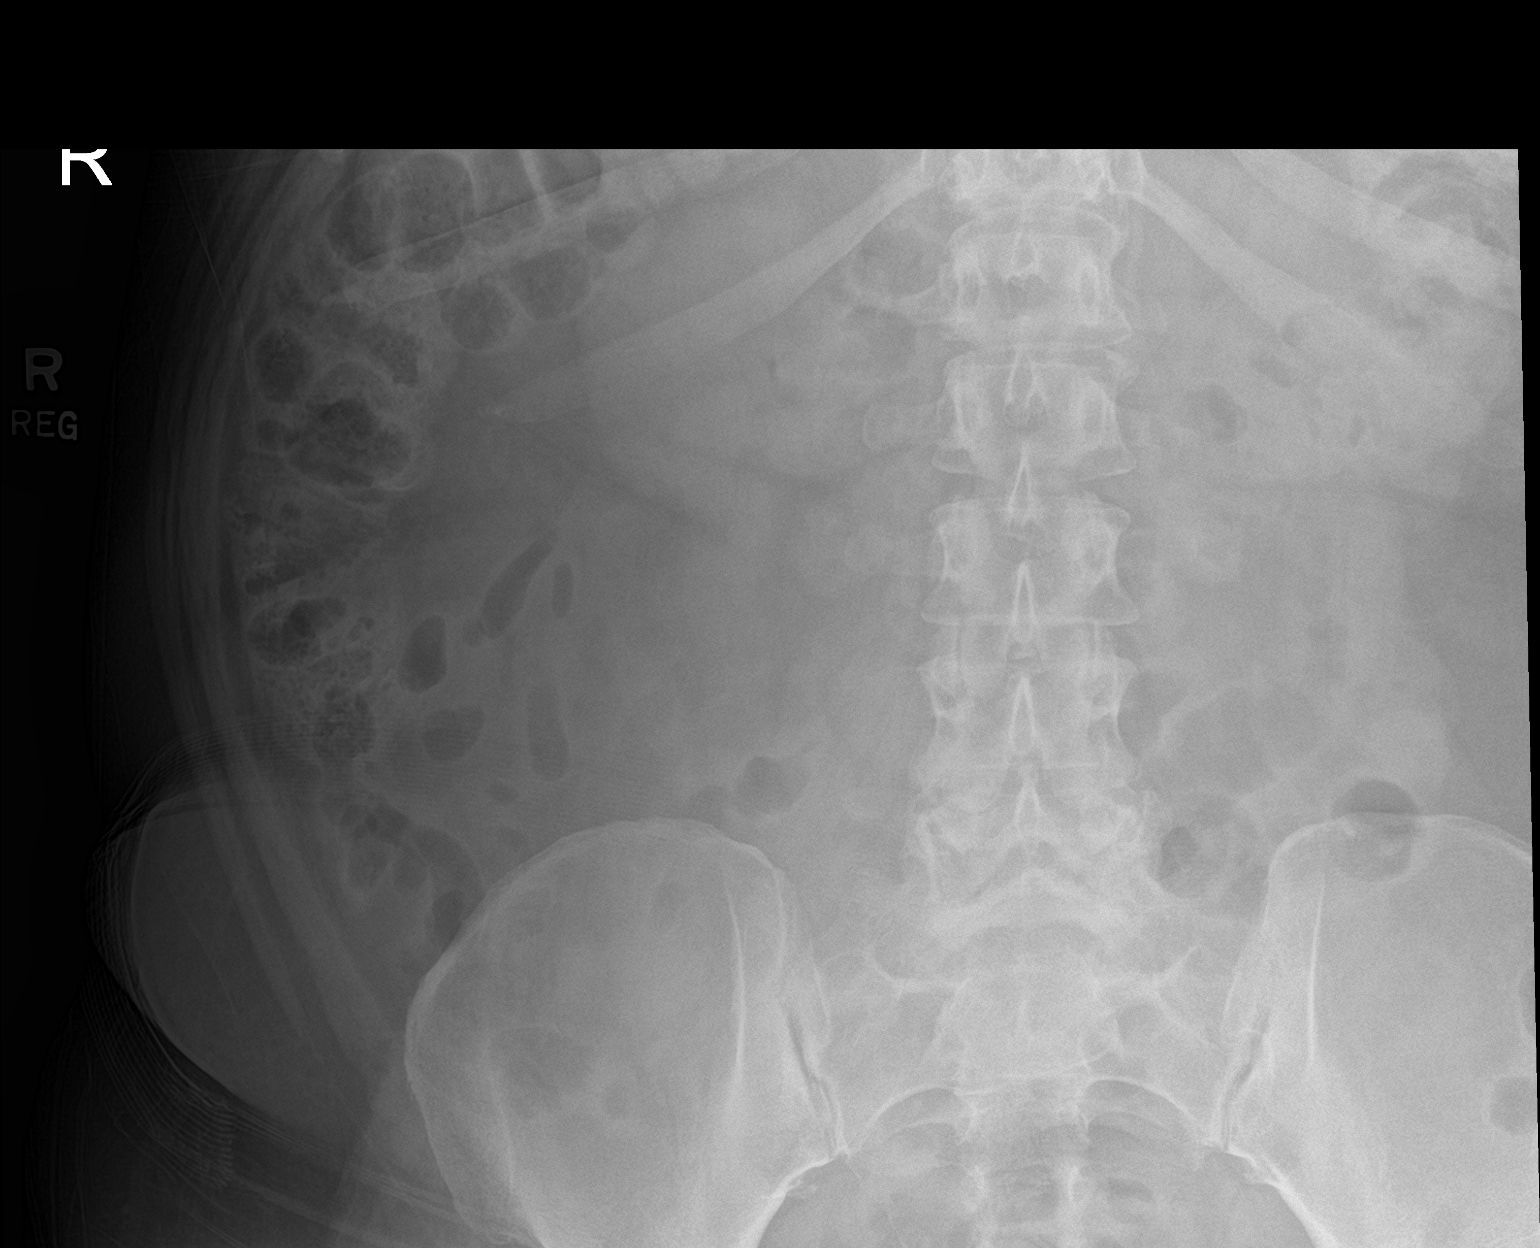

[abdomen kub (3 of 3)]
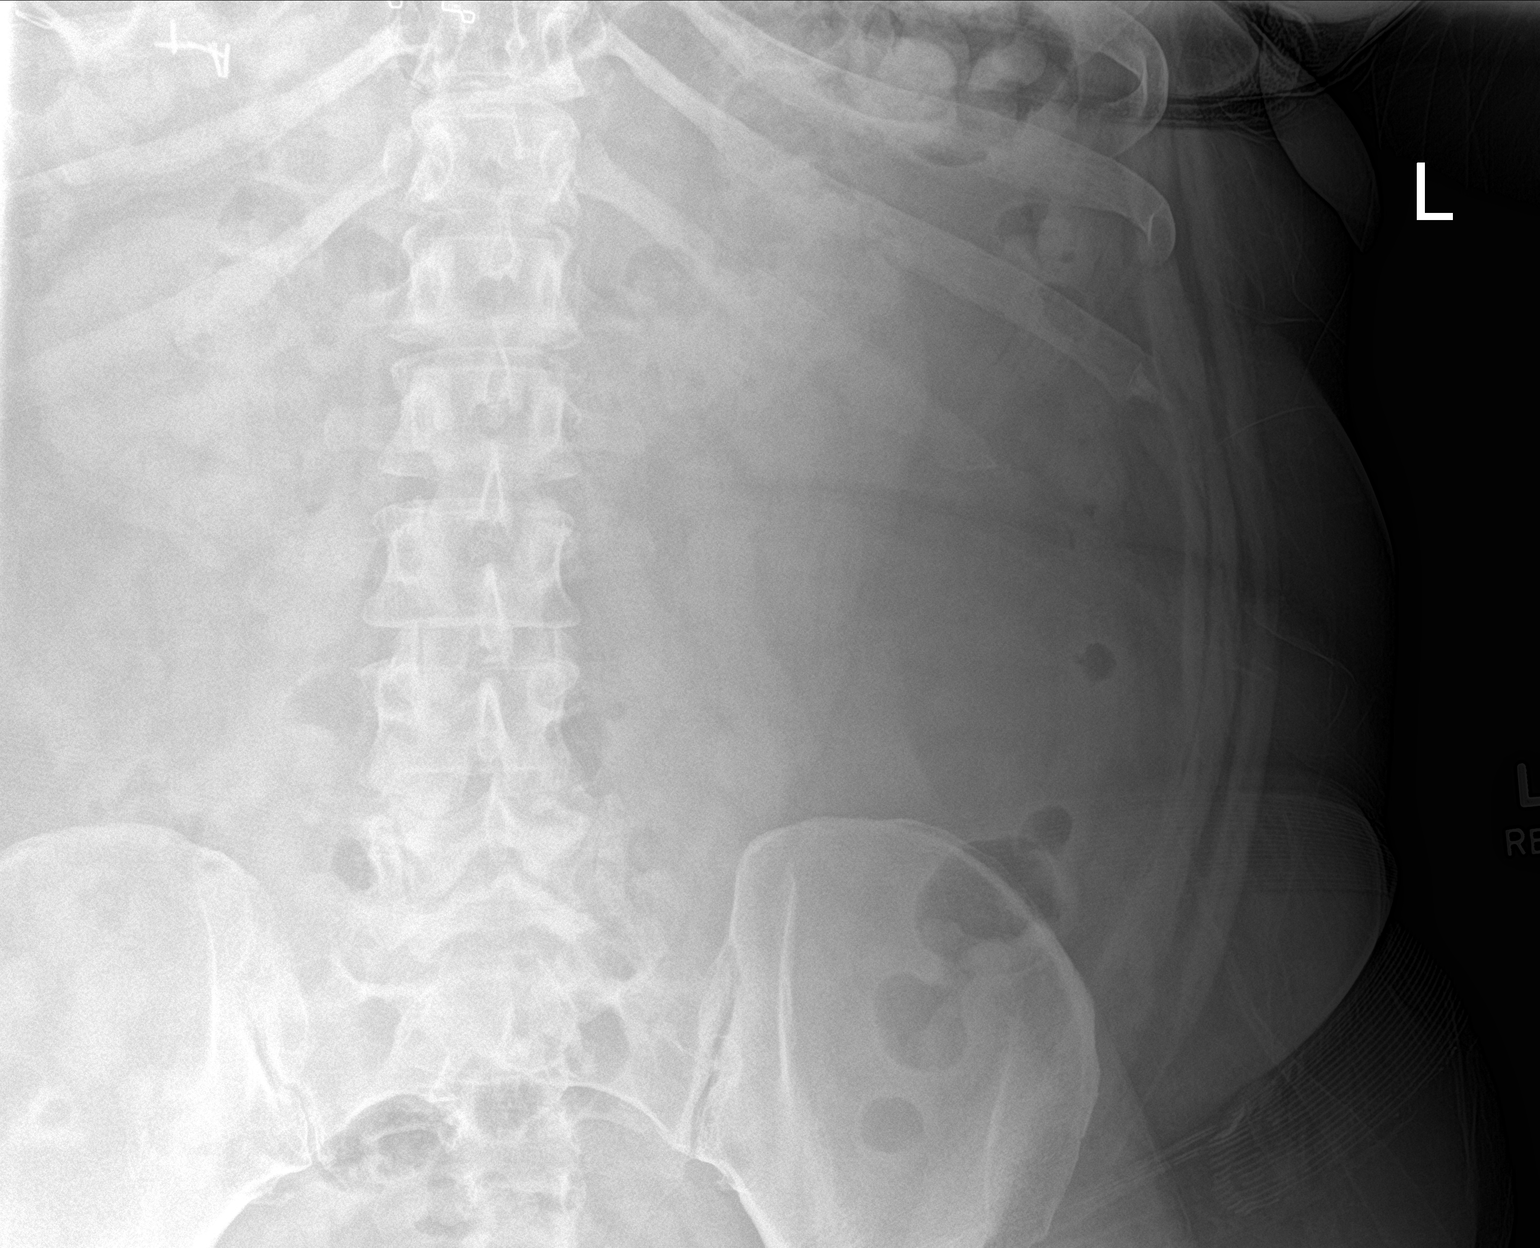

[3 of 3 positions shown; findings below may reference images not displayed]

FINDINGS: The bowel gas pattern is normal. Clips in the right upper quadrant
consistent with previous cholecystectomy. No abnormal abdominal
calcifications or visible mass lesions. Moderately severe facet
arthritis in the lower lumbar spine.
IMPRESSION: Benign-appearing abdomen. Degenerative changes in the lower lumbar
spine.

## 2020-10-16 NOTE — Progress Notes (Deleted)
GYNECOLOGY ANNUAL PREVENTATIVE CARE ENCOUNTER NOTE  History:     Kristin Zhang is a 50 y.o. G0P0000 female here for a routine annual gynecologic exam.  Current complaints: ***.     Denies abnormal vaginal bleeding, discharge, pelvic pain, problems with intercourse or other gynecologic concerns.      Gynecologic History No LMP recorded. Contraception: oral progesterone-only contraceptive Last Pap: 08/2019. Result was normal with negative HPV Last Mammogram: 8/21.  Result was normal   Obstetric History OB History  Gravida Para Term Preterm AB Living  0 0 0 0 0 0  SAB IAB Ectopic Multiple Live Births  0 0 0 0 0    Past Medical History:  Diagnosis Date   Arthritis    High cholesterol    Hypertension    Obesity    PCOS (polycystic ovarian syndrome)    Sleep apnea     Past Surgical History:  Procedure Laterality Date   ABDOMINAL SURGERY     appendix removal     CHOLECYSTECTOMY     gallbladder removal     shoulder surgery     TOTAL KNEE ARTHROPLASTY Right     Current Outpatient Medications on File Prior to Visit  Medication Sig Dispense Refill   atorvastatin (LIPITOR) 20 MG tablet Take 20 mg by mouth daily.     buPROPion (WELLBUTRIN XL) 300 MG 24 hr tablet Take 300 mg by mouth daily.     ciprofloxacin (CIPRO) 250 MG tablet SMARTSIG:1 Tablet(s) By Mouth Every 12 Hours     DULoxetine (CYMBALTA) 30 MG capsule Take by mouth.     gabapentin (NEURONTIN) 100 MG capsule Take 200 mg by mouth at bedtime.     ipratropium (ATROVENT) 0.06 % nasal spray Place 2 sprays into both nostrils 4 (four) times daily. (Patient not taking: Reported on 09/02/2019) 15 mL 0   Levomilnacipran HCl ER (FETZIMA) 80 MG CP24 Take 80 mg by mouth daily.     levothyroxine (SYNTHROID, LEVOTHROID) 150 MCG tablet Take 150 mcg by mouth daily before breakfast.     LOSARTAN POTASSIUM PO Take 150 mg by mouth daily.     Multiple Vitamin (MULTIVITAMIN) tablet Take 1 tablet by mouth daily.     norethindrone  (MICRONOR) 0.35 MG tablet Take 1 tablet (0.35 mg total) by mouth daily. 28 tablet 11   norethindrone-ethinyl estradiol (NORTREL 0.5/35, 28,) 0.5-35 MG-MCG tablet Take 1 tablet by mouth daily. 31 tablet 0   oxycodone-acetaminophen (LYNOX) 10-300 MG tablet Take 1 tablet by mouth every 4 (four) hours as needed for pain.     pantoprazole (PROTONIX) 20 MG tablet Take 1 tablet (20 mg total) by mouth daily. Take 20 minutes before a meal. 30 tablet 0   No current facility-administered medications on file prior to visit.    Allergies  Allergen Reactions   Lisinopril Swelling   Prednisone Other (See Comments)    FACE SWELLED   Metformin And Related     Social History:  reports that she has never smoked. She has never used smokeless tobacco. She reports that she does not drink alcohol and does not use drugs.  Family History  Problem Relation Age of Onset   Breast cancer Sister    Hypertension Mother    Hypertension Father     The following portions of the patient's history were reviewed and updated as appropriate: allergies, current medications, past family history, past medical history, past social history, past surgical history and problem list.  Review of Systems  Pertinent items noted in HPI and remainder of comprehensive ROS otherwise negative.  Physical Exam:  There were no vitals taken for this visit. CONSTITUTIONAL: Well-developed, well-nourished female in no acute distress.  HENT:  Normocephalic, atraumatic, External right and left ear normal.  EYES: Conjunctivae and EOM are normal. Pupils are equal, round, and reactive to light. No scleral icterus.  NECK: Normal range of motion, supple, no masses.  Normal thyroid.  SKIN: Skin is warm and dry. No rash noted. Not diaphoretic. No erythema. No pallor. MUSCULOSKELETAL: Normal range of motion. No tenderness.  No cyanosis, clubbing, or edema. NEUROLOGIC: Alert and oriented to person, place, and time. Normal reflexes, muscle tone  coordination.  PSYCHIATRIC: Normal mood and affect. Normal behavior. Normal judgment and thought content.  CARDIOVASCULAR: Normal heart rate noted, regular rhythm RESPIRATORY: Clear to auscultation bilaterally. Effort and breath sounds normal, no problems with respiration noted.  BREASTS: Symmetric in size. No masses, tenderness, skin changes, nipple drainage, or lymphadenopathy bilaterally. Performed in the presence of a chaperone. ABDOMEN: Soft, no distention noted.  No tenderness, rebound or guarding.  PELVIC: {PELVIC:22980}. Performed in the presence of a chaperone.    Assessment and Plan:  Diagnoses and all orders for this visit:  Encounter for annual routine gynecological examination - Cervical cancer screening: Discussed guidelines. Pap with HPV done {Blank single:19197::"yes","no"} - Breast Health: Encouraged self breast awareness/SBE. Discussed limits of clinical breast exam for detecting breast cancer. Discussed importance of annual MXR.  Rx given - Climacteric/Sexual health: Reviewed typical and atypical symptoms of menopause/peri-menopause. Discussed PMB and to call if any amount of spotting.  - Bone Health: Calcium via diet and supplementation. Discussed weight bearing exercise. DEXA due {Blank single:19197::"yes","no"} - Colonoscopy: {Blank single:19197::"Per PCP","up to date","declines"} - F/U 12 months and prn      Routine preventative health maintenance measures emphasized. Please refer to After Visit Summary for other counseling recommendations.      Milas Hock, MD, FACOG Obstetrician & Gynecologist, [facility] for [facility], [facility]

## 2020-10-18 ENCOUNTER — Ambulatory Visit: Payer: Self-pay | Admitting: Obstetrics and Gynecology

## 2020-10-18 DIAGNOSIS — Z01419 Encounter for gynecological examination (general) (routine) without abnormal findings: Secondary | ICD-10-CM

## 2020-11-05 ENCOUNTER — Emergency Department
Admission: EM | Admit: 2020-11-05 | Discharge: 2020-11-05 | Disposition: A | Discharge status: Home / Self Care | Payer: Self-pay | Source: Home / Self Care

## 2020-11-05 ENCOUNTER — Other Ambulatory Visit: Payer: Self-pay

## 2020-11-05 DIAGNOSIS — R21 Rash and other nonspecific skin eruption: Secondary | ICD-10-CM

## 2020-11-05 DIAGNOSIS — R509 Fever, unspecified: Secondary | ICD-10-CM

## 2020-11-05 DIAGNOSIS — L03116 Cellulitis of left lower limb: Secondary | ICD-10-CM

## 2020-11-05 MED ORDER — DOXYCYCLINE HYCLATE 100 MG PO CAPS: 100.0000 mg | ORAL_CAPSULE | Freq: Two times a day (BID) | ORAL | 0 refills | Status: DC

## 2020-11-05 MED ORDER — FEXOFENADINE HCL 180 MG PO TABS: 180.0000 mg | ORAL_TABLET | Freq: Every day | ORAL | 0 refills | Status: DC

## 2020-11-05 NOTE — ED Triage Notes (Signed)
Pt c/o rash on LT foot that started a week ago. Getting worse. States it itches and burns. Vaseline and zync oxide as well as antifungal cream tried with no relief. Also c/o cough and fever x 2 days. Mother is sick currently as well but hasnt been covid tested. Staying with mom currently since her surgery in Aug.

## 2020-11-05 NOTE — ED Provider Notes (Signed)
[facility]    CSN: [id number] Arrival date & time: 11/05/20  1147      History   Chief Complaint Chief Complaint  Patient presents with   Rash    LT foot   Cough   Fever    HPI Kristin Zhang is a 50 y.o. female.   HPI 50 year old female presents with a rash on foot that started 1 week ago.  And reports getting worst.  Patient reports itching and burning.  Patient reports using Vaseline and zinc oxide as well as antifungal cream to relieve pain without success.  Additionally, patient complains of cough and fever x2 days.  Reports her mother has been sick but is not been tested for COVID-19.  Reports staying with her mother since early August of this year.  Past Medical History:  Diagnosis Date   Arthritis    High cholesterol    Hypertension    Obesity    PCOS (polycystic ovarian syndrome)    Sleep apnea     Patient Active Problem List   Diagnosis Date Noted   Morbid obesity (HCC) 05/18/2017    Past Surgical History:  Procedure Laterality Date   ABDOMINAL SURGERY     appendix removal     CHOLECYSTECTOMY     gallbladder removal     shoulder surgery     TOTAL KNEE ARTHROPLASTY Right     OB History     Gravida  0   Para  0   Term  0   Preterm  0   AB  0   Living  0      SAB  0   IAB  0   Ectopic  0   Multiple  0   Live Births  0            Home Medications    Prior to Admission medications   Medication Sig Start Date End Date Taking? Authorizing Provider  doxycycline (VIBRAMYCIN) 100 MG capsule Take 1 capsule (100 mg total) by mouth 2 (two) times daily. 11/05/20  Yes Trevor Iha, FNP  fexofenadine [facility] ALLERGY) 180 MG tablet Take 1 tablet (180 mg total) by mouth daily for 15 days. 11/05/20 11/20/20 Yes Trevor Iha, FNP  atorvastatin (LIPITOR) 20 MG tablet Take 20 mg by mouth daily.    [provider]  buPROPion (WELLBUTRIN XL) 300 MG 24 hr tablet Take 300 mg by mouth daily.    [provider]   ciprofloxacin (CIPRO) 250 MG tablet SMARTSIG:1 Tablet(s) By Mouth Every 12 Hours 08/24/19   [provider]  DULoxetine (CYMBALTA) 30 MG capsule Take by mouth. 12/02/18   [provider]  gabapentin (NEURONTIN) 100 MG capsule Take 200 mg by mouth at bedtime. 06/21/19   [provider]  ipratropium (ATROVENT) 0.06 % nasal spray Place 2 sprays into both nostrils 4 (four) times daily. Patient not taking: Reported on 09/02/2019 07/15/15   Charm Rings, MD  Levomilnacipran HCl ER (FETZIMA) 80 MG CP24 Take 80 mg by mouth daily.    [provider]  levothyroxine (SYNTHROID, LEVOTHROID) 150 MCG tablet Take 150 mcg by mouth daily before breakfast.    [provider]  LOSARTAN POTASSIUM PO Take 150 mg by mouth daily.    [provider]  Multiple Vitamin (MULTIVITAMIN) tablet Take 1 tablet by mouth daily.    [provider]  norethindrone (MICRONOR) 0.35 MG tablet Take 1 tablet (0.35 mg total) by mouth daily. 09/02/19   Bhambri,  Melanie, CNM  norethindrone-ethinyl estradiol (NORTREL 0.5/35, 28,) 0.5-35 MG-MCG tablet Take 1 tablet by mouth daily. 08/19/19   Rolm Bookbinder, CNM  oxycodone-acetaminophen (LYNOX) 10-300 MG tablet Take 1 tablet by mouth every 4 (four) hours as needed for pain.    [provider]  pantoprazole (PROTONIX) 20 MG tablet Take 1 tablet (20 mg total) by mouth daily. Take 20 minutes before a meal. 05/20/18   Lattie Haw, MD    Family History Family History  Problem Relation Age of Onset   Breast cancer Sister    Hypertension Mother    Hypertension Father     Social History Social History   Tobacco Use   Smoking status: Never   Smokeless tobacco: Never  Vaping Use   Vaping Use: Never used  Substance Use Topics   Alcohol use: No   Drug use: No     Allergies   Lisinopril, Prednisone, and Metformin and related   Review of Systems Review of Systems  Respiratory:  Positive for cough.   Skin:   Positive for rash.  All other systems reviewed and are negative.   Physical Exam Triage Vital Signs ED Triage Vitals  Enc Vitals Group     BP 11/05/20 1305 (!) 138/99     Pulse Rate 11/05/20 1305 66     Resp 11/05/20 1305 19     Temp 11/05/20 1305 99.4 F (37.4 C)     Temp Source 11/05/20 1305 Oral     SpO2 11/05/20 1305 98 %     Weight --      Height --      Head Circumference --      Peak Flow --      Pain Score 11/05/20 1307 0     Pain Loc --      Pain Edu? --      Excl. in GC? --    No data found.  Updated Vital Signs BP (!) 138/99 (BP Location: Right Wrist)   Pulse 66   Temp 99.4 F (37.4 C) (Oral)   Resp 19   SpO2 98%    Physical Exam Vitals and nursing note reviewed.  Constitutional:      General: She is not in acute distress.    Appearance: Normal appearance. She is obese. She is not ill-appearing.  HENT:     Head: Normocephalic and atraumatic.     Mouth/Throat:     Mouth: Mucous membranes are dry.     Pharynx: Oropharynx is clear.  Eyes:     Extraocular Movements: Extraocular movements intact.     Conjunctiva/sclera: Conjunctivae normal.     Pupils: Pupils are equal, round, and reactive to light.  Cardiovascular:     Rate and Rhythm: Normal rate and regular rhythm.     Pulses: Normal pulses.     Heart sounds: Normal heart sounds. No murmur heard. Pulmonary:     Effort: Pulmonary effort is normal.     Breath sounds: Normal breath sounds.     Comments: No adventitious breath sounds noted Musculoskeletal:        General: Normal range of motion.     Cervical back: Normal range of motion and neck supple.  Skin:    General: Skin is warm and dry.     Comments: Left foot (dorsum): Erythematous maculopapular eruption, pruritic in nature, indurated, fluctuant; no lymphatic streaking, discharge, or drainage.  Neurological:     General: No focal deficit present.     Mental Status:  She is alert and oriented to person, place, and time. Mental status is at  baseline.  Psychiatric:        Mood and Affect: Mood normal.        Behavior: Behavior normal.        Thought Content: Thought content normal.     UC Treatments / Results  Labs (all labs ordered are listed, but only abnormal results are displayed) Labs Reviewed  COVID-19, FLU A+B NAA    EKG   Radiology No results found.  Procedures Procedures (including critical care time)  Medications Ordered in UC Medications - No data to display  Initial Impression / Assessment and Plan / UC Course  I have reviewed the triage vital signs and the nursing notes.  Pertinent labs & imaging results that were available during my care of the patient were reviewed by me and considered in my medical decision making (see chart for details).     MDM: 1.  Fever-COVID-19 flu A/B ordered, Advised conservative measurements for now for fever, advised patient may alternate between OTC Tylenol 1000 mg 1-2 times daily, as needed with OTC ibuprofen 800 mg 1-2 times daily, as needed.  Advised patient we will follow-up with COVID-19 flu A/B results return; 2.  Cellulitis of left foot-Rx'd Doxycycline and Allegra. Advised patient to take medication as directed with food to completion.  Advised patient to take Allegra (to stabilize mast cells at skin surface as she is allergic to prednisone) with first dose of doxycycline for the next 10 days.  Encouraged patient increase daily water intake while taking these medications.  Patient discharged home, hemodynamically stable. Final Clinical Impressions(s) / UC Diagnoses   Final diagnoses:  Fever, unspecified  Rash and nonspecific skin eruption  Cellulitis of left foot     Discharge Instructions      Advised conservative measurements for now for fever, advised patient may alternate between OTC Tylenol 1000 mg 1-2 times daily, as needed with OTC ibuprofen 800 mg 1-2 times daily, as needed.  Advised patient we will follow-up with COVID-19 flu A/B results return.   Advised patient to take medication as directed with food to completion.  Advised patient to take Allegra (to stabilize mast cells at skin surface as she is allergic to prednisone) with first dose of doxycycline for the next 10 days.  Encouraged patient increase daily water intake while taking these medications.     ED Prescriptions     Medication Sig Dispense Auth. Provider   doxycycline (VIBRAMYCIN) 100 MG capsule Take 1 capsule (100 mg total) by mouth 2 (two) times daily. 20 capsule Trevor Iha, FNP   fexofenadine [facility] ALLERGY) 180 MG tablet Take 1 tablet (180 mg total) by mouth daily for 15 days. 15 tablet Trevor Iha, FNP      PDMP not reviewed this encounter.   Trevor Iha, FNP 11/05/20 [phone]

## 2020-11-05 NOTE — Discharge Instructions (Addendum)
Advised conservative measurements for now for fever, advised patient may alternate between OTC Tylenol 1000 mg 1-2 times daily, as needed with OTC ibuprofen 800 mg 1-2 times daily, as needed.  Advised patient we will follow-up with COVID-19 flu A/B results return.  Advised patient to take medication as directed with food to completion.  Advised patient to take Allegra (to stabilize mast cells at skin surface as she is allergic to prednisone) with first dose of doxycycline for the next 10 days.  Encouraged patient increase daily water intake while taking these medications.

## 2020-11-09 LAB — COVID-19, FLU A+B NAA
Influenza A, NAA: NOT DETECTED
Influenza B, NAA: NOT DETECTED
SARS-CoV-2, NAA: DETECTED — AB

## 2020-11-25 ENCOUNTER — Other Ambulatory Visit: Payer: Self-pay

## 2020-11-25 ENCOUNTER — Emergency Department
Admission: EM | Admit: 2020-11-25 | Discharge: 2020-11-25 | Disposition: A | Discharge status: Home / Self Care | Payer: Self-pay | Source: Home / Self Care | Attending: Family Medicine | Admitting: Family Medicine

## 2020-11-25 DIAGNOSIS — L03115 Cellulitis of right lower limb: Secondary | ICD-10-CM

## 2020-11-25 DIAGNOSIS — N3281 Overactive bladder: Secondary | ICD-10-CM

## 2020-11-25 MED ORDER — DOXYCYCLINE HYCLATE 100 MG PO CAPS: 100.0000 mg | ORAL_CAPSULE | Freq: Two times a day (BID) | ORAL | 0 refills | Status: DC

## 2020-11-25 MED ORDER — TRIAMCINOLONE ACETONIDE 0.1 % EX CREA: 1.0000 "application " | TOPICAL_CREAM | Freq: Two times a day (BID) | CUTANEOUS | 0 refills | Status: DC

## 2020-11-25 MED ORDER — OXYBUTYNIN CHLORIDE ER 5 MG PO TB24: 5.0000 mg | ORAL_TABLET | Freq: Every day | ORAL | 0 refills | Status: DC

## 2020-11-25 NOTE — ED Provider Notes (Signed)
[facility]    CSN: [id number] Arrival date & time: 11/25/20  1524      History   Chief Complaint Chief Complaint  Patient presents with   Foot Pain    Pt states that she has some swelling, pain and a rash of right foot. X4-5 days    HPI Kristin Zhang is a 50 y.o. female.   HPI  Patient is here for swelling and redness of her right foot.  She states it is painful.  She has had cellulitis on her left foot.  She does not know why her foot is infected.  Also complains of overactive bladder.  Has urinary frequency especially at night.  Would like a refill of her overactive bladder medication.  Does not remember the name.  I will try Ditropan since this is the least expensive  Past Medical History:  Diagnosis Date   Arthritis    High cholesterol    Hypertension    Obesity    PCOS (polycystic ovarian syndrome)    Sleep apnea     Patient Active Problem List   Diagnosis Date Noted   Morbid obesity (HCC) 05/18/2017    Past Surgical History:  Procedure Laterality Date   ABDOMINAL SURGERY     appendix removal     CHOLECYSTECTOMY     gallbladder removal     shoulder surgery     TOTAL KNEE ARTHROPLASTY Right     OB History     Gravida  0   Para  0   Term  0   Preterm  0   AB  0   Living  0      SAB  0   IAB  0   Ectopic  0   Multiple  0   Live Births  0            Home Medications    Prior to Admission medications   Medication Sig Start Date End Date Taking? Authorizing Provider  atorvastatin (LIPITOR) 20 MG tablet Take 20 mg by mouth daily.   Yes [provider]  buPROPion (WELLBUTRIN XL) 300 MG 24 hr tablet Take 300 mg by mouth daily.   Yes [provider]  doxycycline (VIBRAMYCIN) 100 MG capsule Take 1 capsule (100 mg total) by mouth 2 (two) times daily. 11/25/20  Yes Eustace Moore, MD  DULoxetine (CYMBALTA) 30 MG capsule Take by mouth. 12/02/18  Yes [provider]  gabapentin (NEURONTIN) 100  MG capsule Take 200 mg by mouth at bedtime. 06/21/19  Yes [provider]  ipratropium (ATROVENT) 0.06 % nasal spray Place 2 sprays into both nostrils 4 (four) times daily. 07/15/15  Yes Charm Rings, MD  Levomilnacipran HCl ER 80 MG CP24 Take 80 mg by mouth daily.   Yes [provider]  levothyroxine (SYNTHROID, LEVOTHROID) 150 MCG tablet Take 150 mcg by mouth daily before breakfast.   Yes [provider]  LOSARTAN POTASSIUM PO Take 150 mg by mouth daily.   Yes [provider]  Multiple Vitamin (MULTIVITAMIN) tablet Take 1 tablet by mouth daily.   Yes [provider]  norethindrone-ethinyl estradiol (NORTREL 0.5/35, 28,) 0.5-35 MG-MCG tablet Take 1 tablet by mouth daily. 08/19/19  Yes Rolm Bookbinder, CNM  oxybutynin (DITROPAN XL) 5 MG 24 hr tablet Take 1 tablet (5 mg total) by mouth at bedtime. 11/25/20  Yes Eustace Moore, MD  oxycodone-acetaminophen (LYNOX) 10-300 MG tablet Take 1 tablet by mouth every 4 (  four) hours as needed for pain.   Yes [provider]  pantoprazole (PROTONIX) 20 MG tablet Take 1 tablet (20 mg total) by mouth daily. Take 20 minutes before a meal. 05/20/18  Yes Beese, Tera Mater, MD  triamcinolone cream (KENALOG) 0.1 % Apply 1 application topically 2 (two) times daily. 11/25/20  Yes Eustace Moore, MD  fexofenadine [facility] ALLERGY) 180 MG tablet Take 1 tablet (180 mg total) by mouth daily for 15 days. 11/05/20 11/20/20  Trevor Iha, FNP    Family History Family History  Problem Relation Age of Onset   Breast cancer Sister    Hypertension Mother    Hypertension Father     Social History Social History   Tobacco Use   Smoking status: Never   Smokeless tobacco: Never  Vaping Use   Vaping Use: Never used  Substance Use Topics   Alcohol use: No   Drug use: No     Allergies   Lisinopril, Prednisone, and Metformin and related   Review of Systems Review of Systems See HPI  Physical Exam Triage  Vital Signs ED Triage Vitals  Enc Vitals Group     BP 11/25/20 1607 137/82     Pulse Rate 11/25/20 1607 62     Resp 11/25/20 1607 18     Temp 11/25/20 1607 98.4 F (36.9 C)     Temp Source 11/25/20 1607 Oral     SpO2 11/25/20 1607 99 %     Weight 11/25/20 1605 300 lb (136.1 kg)     Height 11/25/20 1605 5\' 6"  (1.676 m)     Head Circumference --      Peak Flow --      Pain Score 11/25/20 1605 8     Pain Loc --      Pain Edu? --      Excl. in GC? --    No data found.  Updated Vital Signs BP 137/82 (BP Location: Right Arm)   Pulse 62   Temp 98.4 F (36.9 C) (Oral)   Resp 18   Ht 5\' 6"  (1.676 m)   Wt 136.1 kg   SpO2 99%   BMI 48.42 kg/m      Physical Exam Constitutional:      General: She is not in acute distress.    Appearance: She is well-developed. She is obese.  HENT:     Head: Normocephalic and atraumatic.     Mouth/Throat:     Comments: Yes mask is in place Eyes:     Conjunctiva/sclera: Conjunctivae normal.     Pupils: Pupils are equal, round, and reactive to light.  Cardiovascular:     Rate and Rhythm: Normal rate.  Pulmonary:     Effort: Pulmonary effort is normal. No respiratory distress.  Abdominal:     General: There is no distension.     Palpations: Abdomen is soft.  Musculoskeletal:        General: Normal range of motion.     Cervical back: Normal range of motion.       Feet:  Skin:    General: Skin is warm and dry.     Comments: Both feet have edema.  The top of the right foot has a deeply erythematous punctate palpable papular rash, no vesicles.  She states is quite tender.  Neurological:     Mental Status: She is alert.     Gait: Gait abnormal.     UC Treatments / Results  Labs (all labs ordered are  listed, but only abnormal results are displayed) Labs Reviewed - No data to display  EKG   Radiology No results found.  Procedures Procedures (including critical care time)  Medications Ordered in UC Medications - No data to  display  Initial Impression / Assessment and Plan / UC Course  I have reviewed the triage vital signs and the nursing notes.  Pertinent labs & imaging results that were available during my care of the patient were reviewed by me and considered in my medical decision making (see chart for details).     Final Clinical Impressions(s) / UC Diagnoses   Final diagnoses:  Cellulitis of right foot  OAB (overactive bladder)   Discharge Instructions   None    ED Prescriptions     Medication Sig Dispense Auth. Provider   oxybutynin (DITROPAN XL) 5 MG 24 hr tablet Take 1 tablet (5 mg total) by mouth at bedtime. 30 tablet Eustace Moore, MD   doxycycline (VIBRAMYCIN) 100 MG capsule Take 1 capsule (100 mg total) by mouth 2 (two) times daily. 14 capsule Eustace Moore, MD   triamcinolone cream (KENALOG) 0.1 % Apply 1 application topically 2 (two) times daily. 15 g Eustace Moore, MD      I have reviewed the PDMP during this encounter.   Eustace Moore, MD 11/25/20 [phone]

## 2020-11-25 NOTE — ED Triage Notes (Signed)
Pt states that she was recently seen for her left foot. Pt states that now she has some right foot pain, swelling and a rash. X3-4 days

## 2020-11-25 NOTE — Discharge Instructions (Signed)
Take the antibiotic doxycycline 2 times a day with food Use the cream 2 times a day for the rash I have given you a 1 month supply of Ditropan which will help with your bladder See a primary care doctor for follow-up

## 2022-07-29 ENCOUNTER — Encounter: Payer: Self-pay | Admitting: Student

## 2022-07-29 ENCOUNTER — Ambulatory Visit: Payer: Medicare HMO | Admitting: Student

## 2022-07-29 ENCOUNTER — Other Ambulatory Visit (HOSPITAL_COMMUNITY)
Admission: RE | Admit: 2022-07-29 | Discharge: 2022-07-29 | Disposition: A | Discharge status: Home / Self Care | Payer: Medicare HMO | Source: Ambulatory Visit | Attending: Student | Admitting: Student

## 2022-07-29 VITALS — Resp 16 | Ht 68.0 in | Wt 316.0 lb

## 2022-07-29 DIAGNOSIS — Z1151 Encounter for screening for human papillomavirus (HPV): Secondary | ICD-10-CM | POA: Diagnosis not present

## 2022-07-29 DIAGNOSIS — N898 Other specified noninflammatory disorders of vagina: Secondary | ICD-10-CM | POA: Insufficient documentation

## 2022-07-29 DIAGNOSIS — Z01419 Encounter for gynecological examination (general) (routine) without abnormal findings: Secondary | ICD-10-CM

## 2022-07-29 NOTE — Progress Notes (Signed)
   ANNUAL EXAM Patient name: Kristin Zhang MRN [id number]  Date of birth: 1970-12-04 Chief Complaint:   Annual Exam  History of Present Illness:   Kristin Zhang is a 52 y.o. G0P0000 Caucasian female being seen today for a routine annual exam.  Current complaints: none  Patient's last menstrual period was 08/15/2019. No bleeding or spotting. Hot flashes and saddened mood well managed with Wellbutrin. Describes good working relationship with her PCP.   The pregnancy intention screening data noted above was reviewed. Potential methods of contraception were discussed. The patient elected to proceed with No data recorded.   Last pap 2021. Results were: NILM w/ HRHPV negative. H/O abnormal pap: no Last mammogram: 02/2022. Results were: abnormal 83month follow-up  . Family h/o breast cancer: yes - Sister diagnosed at 53 and survived, recurrent recently requiring Chemotherapy Last colonoscopy: 2022. Results were: normal. Family h/o colorectal cancer: no      No data to display               No data to display           Review of Systems:   Pertinent items are noted in HPI Denies any headaches, blurred vision, fatigue, shortness of breath, chest pain, abdominal pain, abnormal vaginal discharge/itching/odor/irritation, problems with periods, bowel movements, urination, or intercourse unless otherwise stated above. Pertinent History Reviewed:  Reviewed past medical,surgical, social and family history.  Reviewed problem list, medications and allergies. Physical Assessment:   Vitals:   07/29/22 1413  Resp: 16  Weight: (!) 316 lb (143.3 kg)  Height: 5\' 8"  (1.727 m)  Body mass index is 48.05 kg/m.        Physical Examination:   General appearance - well appearing, and in no distress  Mental status - alert, oriented to person, place, and time  Psych:  She has a normal mood and affect  Skin - warm and dry, normal color, no suspicious lesions noted  Chest - effort normal, all  lung fields clear to auscultation bilaterally  Heart - normal rate and regular rhythm  Breasts - breasts appear normal  Abdomen - soft, nontender, nondistended, no masses or organomegaly  Pelvic - VULVA: no masses or lesions, +erythema and skin irritation noted bilaterally VAGINA: normal appearing vagina with normal color and mild atrophy, clear, whitish discharge, no lesions CERVIX: normal appearing cervix without discharge or lesions, no CMT  Thin prep pap is done with HR HPV cotesting  UTERUS: unable to palpate due to habitus  Extremities:  No swelling or varicosities noted  Chaperone present for exam  No results found for this or any previous visit (from the past 24 hour(s)).  Assessment & Plan:   1. Women's annual routine gynecological examination - Cytology - PAP  2. Vaginal discharge - Cervicovaginal ancillary only( [place])   Labs/procedures today: Pap   Mammogram:  as recommended in August  , or sooner if problems Colonoscopy: per GI, or sooner if problems  No orders of the defined types were placed in this encounter.   Meds: No orders of the defined types were placed in this encounter.   Follow-up: No follow-ups on file.  Corlis Hove, NP 07/29/2022 2:48 PM

## 2022-07-30 LAB — CERVICOVAGINAL ANCILLARY ONLY
Bacterial Vaginitis (gardnerella): NEGATIVE
Candida Glabrata: NEGATIVE
Candida Vaginitis: NEGATIVE
Comment: NEGATIVE
Comment: NEGATIVE
Comment: NEGATIVE

## 2022-07-31 LAB — CYTOLOGY - PAP
Comment: NEGATIVE
Diagnosis: NEGATIVE
High risk HPV: NEGATIVE

## 2022-08-02 ENCOUNTER — Other Ambulatory Visit: Payer: Self-pay | Admitting: Student

## 2022-08-02 DIAGNOSIS — N898 Other specified noninflammatory disorders of vagina: Secondary | ICD-10-CM

## 2022-08-02 MED ORDER — TRIAMCINOLONE ACETONIDE 0.1 % EX OINT
1.0000 | TOPICAL_OINTMENT | Freq: Two times a day (BID) | CUTANEOUS | 0 refills | Status: AC
Start: 2022-08-02 — End: ?

## 2022-08-02 MED ORDER — FLUCONAZOLE 150 MG PO TABS: 150.0000 mg | ORAL_TABLET | Freq: Every day | ORAL | 0 refills | Status: DC

## 2022-08-04 NOTE — Progress Notes (Signed)
T notified of normal pap smear and she doesn't need another one for 5 years.  Also she was prescribed an oral and topical antifungal for her vaginal area.  If symptoms are not resolved she will need to see an MD>

## 2024-01-12 ENCOUNTER — Ambulatory Visit
Admission: RE | Admit: 2024-01-12 | Discharge: 2024-01-12 | Disposition: A | Discharge status: Home / Self Care | Source: Ambulatory Visit | Attending: Family Medicine | Admitting: Family Medicine

## 2024-01-12 VITALS — BP 137/76 | HR 102 | Temp 102.9°F | Resp 18 | Ht 66.0 in | Wt 310.0 lb

## 2024-01-12 DIAGNOSIS — J101 Influenza due to other identified influenza virus with other respiratory manifestations: Secondary | ICD-10-CM

## 2024-01-12 LAB — POCT INFLUENZA A/B
Influenza A, POC: POSITIVE — AB
Influenza B, POC: NEGATIVE

## 2024-01-12 LAB — POC SOFIA SARS ANTIGEN FIA: SARS Coronavirus 2 Ag: NEGATIVE

## 2024-01-12 MED ORDER — PROMETHAZINE-DM 6.25-15 MG/5ML PO SYRP: 5.0000 mL | ORAL_SOLUTION | Freq: Four times a day (QID) | ORAL | 0 refills | Status: AC | PRN

## 2024-01-12 MED ORDER — OSELTAMIVIR PHOSPHATE 75 MG PO CAPS: 75.0000 mg | ORAL_CAPSULE | Freq: Two times a day (BID) | ORAL | 0 refills | Status: AC

## 2024-01-12 MED ORDER — ACETAMINOPHEN 325 MG PO TABS
650.0000 mg | ORAL_TABLET | Freq: Once | ORAL | Status: AC
  Administered 2024-01-12: 650 mg via ORAL

## 2024-01-12 NOTE — ED Provider Notes (Signed)
 " [facility]    CSN: [id number] Arrival date & time: 01/12/24  1717      History   Chief Complaint Chief Complaint  Patient presents with   Cough    Chills, fever - Entered by patient    HPI Kristin Zhang is a 53 y.o. female.   HPI Patient complains of fever chills weakness and cough since yesterday.  Body aches.  Presents with a fever of 102.9.  Has not taken any medication.  Is worried about taking Tylenol  because of the past history of liver impairment.  I checked her most recent CMP and her liver functions are really normal.  I think she can have a dose of Tylenol  to reduce her fever.  This is discussed with patient.  Past Medical History:  Diagnosis Date   Arthritis    High cholesterol    Hypertension    Obesity    PCOS (polycystic ovarian syndrome)    Sleep apnea     Patient Active Problem List   Diagnosis Date Noted   Morbid obesity (HCC) 05/18/2017    Past Surgical History:  Procedure Laterality Date   ABDOMINAL SURGERY     appendix removal     CHOLECYSTECTOMY     gallbladder removal     shoulder surgery     TOTAL KNEE ARTHROPLASTY Right     OB History     Gravida  0   Para  0   Term  0   Preterm  0   AB  0   Living  0      SAB  0   IAB  0   Ectopic  0   Multiple  0   Live Births  0            Home Medications    Prior to Admission medications  Medication Sig Start Date End Date Taking? Authorizing Provider  buPROPion (WELLBUTRIN XL) 300 MG 24 hr tablet Take 300 mg by mouth daily.   Yes [provider]  levothyroxine (SYNTHROID, LEVOTHROID) 150 MCG tablet Take 150 mcg by mouth daily before breakfast.   Yes [provider]  LOSARTAN POTASSIUM PO Take 150 mg by mouth daily.   Yes [provider]  Multiple Vitamin (MULTIVITAMIN) tablet Take 1 tablet by mouth daily.   Yes [provider]  orphenadrine (NORFLEX) 100 MG tablet Take 100 mg by mouth 2 (two) times daily.   Yes  [provider]  oseltamivir  (TAMIFLU ) 75 MG capsule Take 1 capsule (75 mg total) by mouth every 12 (twelve) hours. 01/12/24  Yes Maranda Jamee Jacob, MD  promethazine -dextromethorphan (PROMETHAZINE -DM) 6.25-15 MG/5ML syrup Take 5 mLs by mouth 4 (four) times daily as needed for cough. 01/12/24  Yes Maranda Jamee Jacob, MD  triamcinolone  ointment (KENALOG ) 0.1 % Apply 1 Application topically 2 (two) times daily. To exterior of vaginal area where irritation is present. Do not place internally. 08/02/22  Yes Forlan, Nicole, NP  Levomilnacipran HCl ER 80 MG CP24 Take 80 mg by mouth daily. Patient not taking: Reported on 07/29/2022    [provider]    Family History Family History  Problem Relation Age of Onset   Hypertension Mother    Hypertension Father    Breast cancer Sister     Social History Social History[1]   Allergies   Lisinopril, Atorvastatin, Doxycycline , Prednisone, and Metformin and related   Review of Systems Review of Systems  See HPI Physical Exam Triage Vital Signs  ED Triage Vitals  Encounter Vitals Group     BP 01/12/24 1742 137/76     Girls Systolic BP Percentile --      Girls Diastolic BP Percentile --      Boys Systolic BP Percentile --      Boys Diastolic BP Percentile --      Pulse Rate 01/12/24 1742 (!) 102     Resp 01/12/24 1742 18     Temp 01/12/24 1742 (!) 102.9 F (39.4 C)     Temp Source 01/12/24 1742 Oral     SpO2 01/12/24 1742 93 %     Weight 01/12/24 1740 (!) 310 lb (140.6 kg)     Height 01/12/24 1740 5' 6 (1.676 m)     Head Circumference --      Peak Flow --      Pain Score 01/12/24 1740 0     Pain Loc --      Pain Education --      Exclude from Growth Chart --    No data found.  Updated Vital Signs BP 137/76 (BP Location: Right Arm)   Pulse (!) 102   Temp (!) 102.9 F (39.4 C) (Oral)   Resp 18   Ht 5' 6 (1.676 m)   Wt (!) 140.6 kg   LMP 08/15/2019   SpO2 93%   BMI 50.04 kg/m      Physical  Exam Constitutional:      General: She is not in acute distress.    Appearance: She is well-developed. She is obese. She is ill-appearing.  HENT:     Head: Normocephalic and atraumatic.     Mouth/Throat:     Mouth: Mucous membranes are moist.     Pharynx: No posterior oropharyngeal erythema.  Eyes:     Conjunctiva/sclera: Conjunctivae normal.     Pupils: Pupils are equal, round, and reactive to light.  Cardiovascular:     Rate and Rhythm: Normal rate and regular rhythm.     Heart sounds: Normal heart sounds.  Pulmonary:     Effort: Pulmonary effort is normal. No respiratory distress.     Breath sounds: Normal breath sounds.  Musculoskeletal:        General: Normal range of motion.     Cervical back: Normal range of motion.  Skin:    General: Skin is warm and dry.  Neurological:     Mental Status: She is alert.      UC Treatments / Results  Labs (all labs ordered are listed, but only abnormal results are displayed) Labs Reviewed  POCT INFLUENZA A/B - Abnormal; Notable for the following components:      Result Value   Influenza A, POC Positive (*)    All other components within normal limits  POC SOFIA SARS ANTIGEN FIA - Normal    EKG   Radiology No results found.  Procedures Procedures (including critical care time)  Medications Ordered in UC Medications  acetaminophen  (TYLENOL ) tablet 650 mg (650 mg Oral Given 01/12/24 1750)    Initial Impression / Assessment and Plan / UC Course  I have reviewed the triage vital signs and the nursing notes.  Pertinent labs & imaging results that were available during my care of the patient were reviewed by me and considered in my medical decision making (see chart for details).     Discussed that flu A is positive Recommend Tamiflu  Discussed Tylenol  use Discussed over-the-counter medicine use Patient requests a prescription for Phenergan  DM Follow-up with  PCP  Final Clinical Impressions(s) / UC Diagnoses   Final  diagnoses:  Influenza A     Discharge Instructions      Take the Tamiflu  2 times a day for 5 days Make sure you are drinking lots of fluids May take Tylenol  for pain and fever.  Do not take more than 3000 mg a day May use Mucinex DM May use Coricidin HBP These are safe with your other medications I have also prescribed Phenergan  DM to take for the cough Call for problems   ED Prescriptions     Medication Sig Dispense Auth. Provider   oseltamivir  (TAMIFLU ) 75 MG capsule Take 1 capsule (75 mg total) by mouth every 12 (twelve) hours. 10 capsule Maranda Jamee Jacob, MD   promethazine -dextromethorphan (PROMETHAZINE -DM) 6.25-15 MG/5ML syrup Take 5 mLs by mouth 4 (four) times daily as needed for cough. 118 mL Maranda Jamee Jacob, MD      PDMP not reviewed this encounter.    [1]  Social History Tobacco Use   Smoking status: Never   Smokeless tobacco: Never  Vaping Use   Vaping status: Never Used  Substance Use Topics   Alcohol use: No   Drug use: No     Maranda Jamee Jacob, MD 01/12/24 1933  "

## 2024-01-12 NOTE — Discharge Instructions (Signed)
 Take the Tamiflu  2 times a day for 5 days Make sure you are drinking lots of fluids May take Tylenol  for pain and fever.  Do not take more than 3000 mg a day May use Mucinex DM May use Coricidin HBP These are safe with your other medications I have also prescribed Phenergan  DM to take for the cough Call for problems

## 2024-01-12 NOTE — ED Triage Notes (Signed)
 Patient c/o low grade fever, cough and congestion x 2 days.  Patient has taken Chloracidin.
